# Patient Record
Sex: Male | Born: 1985 | Race: White | Hispanic: No | Marital: Single | Smoking: Current every day smoker
Health system: Southern US, Community
[De-identification: ages and names within clinical notes are randomized; demographics above are authoritative.]

## PROBLEM LIST (undated history)

## (undated) DIAGNOSIS — J45909 Unspecified asthma, uncomplicated: Secondary | ICD-10-CM

## (undated) DIAGNOSIS — K219 Gastro-esophageal reflux disease without esophagitis: Secondary | ICD-10-CM

## (undated) HISTORY — DX: Gastro-esophageal reflux disease without esophagitis: K21.9

---

## 2013-05-28 ENCOUNTER — Emergency Department: Payer: Self-pay | Admitting: Emergency Medicine

## 2013-06-04 ENCOUNTER — Emergency Department: Payer: Self-pay | Admitting: Emergency Medicine

## 2013-06-15 ENCOUNTER — Emergency Department: Payer: Self-pay | Admitting: Emergency Medicine

## 2013-12-18 ENCOUNTER — Emergency Department: Payer: Self-pay | Admitting: Emergency Medicine

## 2015-02-06 ENCOUNTER — Encounter: Payer: Self-pay | Admitting: Emergency Medicine

## 2015-02-06 ENCOUNTER — Emergency Department
Admission: EM | Admit: 2015-02-06 | Discharge: 2015-02-07 | Disposition: A | Discharge status: Home / Self Care | Payer: Self-pay | Attending: Emergency Medicine | Admitting: Emergency Medicine

## 2015-02-06 DIAGNOSIS — J4521 Mild intermittent asthma with (acute) exacerbation: Secondary | ICD-10-CM

## 2015-02-06 DIAGNOSIS — Z720 Tobacco use: Secondary | ICD-10-CM | POA: Insufficient documentation

## 2015-02-06 DIAGNOSIS — Z880 Allergy status to penicillin: Secondary | ICD-10-CM | POA: Insufficient documentation

## 2015-02-06 DIAGNOSIS — J45901 Unspecified asthma with (acute) exacerbation: Secondary | ICD-10-CM | POA: Insufficient documentation

## 2015-02-06 HISTORY — DX: Unspecified asthma, uncomplicated: J45.909

## 2015-02-06 MED ORDER — PREDNISONE 20 MG PO TABS: 60.0000 mg | ORAL_TABLET | Freq: Every day | ORAL | Status: AC

## 2015-02-06 MED ORDER — IPRATROPIUM-ALBUTEROL 0.5-2.5 (3) MG/3ML IN SOLN
3.0000 mL | Freq: Once | RESPIRATORY_TRACT | Status: AC
  Administered 2015-02-06: 3 mL via RESPIRATORY_TRACT

## 2015-02-06 MED ORDER — PREDNISONE 20 MG PO TABS
60.0000 mg | ORAL_TABLET | ORAL | Status: AC
  Administered 2015-02-06: 60 mg via ORAL

## 2015-02-06 MED ORDER — PREDNISONE 20 MG PO TABS
ORAL_TABLET | ORAL | Status: AC
  Filled 2015-02-06: qty 3

## 2015-02-06 MED ORDER — ALBUTEROL SULFATE HFA 108 (90 BASE) MCG/ACT IN AERS: 4.0000 | INHALATION_SPRAY | RESPIRATORY_TRACT | Status: DC | PRN

## 2015-02-06 MED ORDER — IPRATROPIUM-ALBUTEROL 0.5-2.5 (3) MG/3ML IN SOLN
RESPIRATORY_TRACT | Status: AC
  Filled 2015-02-06: qty 3

## 2015-02-06 NOTE — ED Provider Notes (Signed)
[facility] Emergency Department Provider Note    ____________________________________________  Time seen: 2340  I have reviewed the triage vital signs and the nursing notes.   HISTORY  Chief Complaint Asthma    HPI Alexander David is a 29 y.o. male who presents with increased work of breathing, wheezing, and mild shortness of breath starting today. He notes that he has mild, well-controlled asthma, and this feels similar to prior exacerbations. He was visiting the beach and staying in a hotel when he noticed his symptoms worsening today. He is not in any distress, but feels like he needs some medicine, and he is out of his inhaler. He has no primary care doctor. Nothing makes it better, and he notes slightly increased work of breathing with increased exertion. He denies chest pain, fever, chills, abdominal pain, nausea/vomiting.     Past Medical History  Diagnosis Date  . Asthma     There are no active problems to display for this patient.   History reviewed. No pertinent past surgical history.  Current Outpatient Rx  Name  Route  Sig  Dispense  Refill  . albuterol (PROVENTIL HFA;VENTOLIN HFA) 108 (90 BASE) MCG/ACT inhaler   Inhalation   Inhale into the lungs every 6 (six) hours as needed for wheezing or shortness of breath.           Allergies Penicillins and Sulfur  History reviewed. No pertinent family history.  Social History History  Substance Use Topics  . Smoking status: Current Every Day Smoker -- 1.00 packs/day  . Smokeless tobacco: Not on file  . Alcohol Use: No    Review of Systems  Constitutional: Negative for fever. Eyes: Negative for visual changes. ENT: Negative for sore throat. Cardiovascular: Negative for chest pain. Respiratory: Mild shortness of breath and wheezing. No accessory muscle use. Gastrointestinal: Negative for abdominal pain, vomiting and diarrhea. Genitourinary: Negative for dysuria. Musculoskeletal:  Negative for back pain. Skin: Negative for rash. Neurological: Negative for headaches, focal weakness or numbness.   10-point ROS otherwise negative.  ____________________________________________   PHYSICAL EXAM:  VITAL SIGNS: ED Triage Vitals  Enc Vitals Group     BP 02/06/15 2055 140/88 mmHg     Pulse Rate 02/06/15 2055 72     Resp --      Temp 02/06/15 2055 98.5 F (36.9 C)     Temp Source 02/06/15 2055 Oral     SpO2 02/06/15 2055 99 %     Weight 02/06/15 2055 250 lb (113.399 kg)     Height 02/06/15 2055 6' (1.829 m)     Head Cir --      Peak Flow --      Pain Score --      Pain Loc --      Pain Edu? --      Excl. in GC? --      Constitutional: Alert and oriented. Well appearing and in no distress. Some mild audible wheezes when the patient laughs. Eyes: Conjunctivae are normal. PERRL. Normal extraocular movements. ENT   Head: Normocephalic and atraumatic.   Nose: No congestion/rhinnorhea.   Mouth/Throat: Mucous membranes are moist.   Neck: No stridor. Hematological/Lymphatic/Immunilogical: No cervical lymphadenopathy. Cardiovascular: Normal rate, regular rhythm. Normal and symmetric distal pulses are present in all extremities. No murmurs, rubs, or gallops. Respiratory: Normal respiratory effort without tachypnea nor retractions. Mild to moderate wheezing throughout all lung fields. No accessory muscle usage. Good air movement in spite of wheezes. Gastrointestinal: Soft and nontender. No  distention. No abdominal bruits. There is no CVA tenderness. Moderate obesity. Genitourinary: Deferred Musculoskeletal: Nontender with normal range of motion in all extremities. No joint effusions.  No lower extremity tenderness nor edema. Neurologic:  Normal speech and language. No gross focal neurologic deficits are appreciated. Speech is normal. No gait instability. Skin:  Skin is warm, dry and intact. No rash noted. Psychiatric: Mood and affect are normal. Speech  and behavior are normal. Patient exhibits appropriate insight and judgment.  ____________________________________________   EKG  None  ____________________________________________    RADIOLOGY  Not indicated  ____________________________________________   PROCEDURES  Procedure(s) performed: None  Critical Care performed: No  ____________________________________________   INITIAL IMPRESSION / ASSESSMENT AND PLAN / ED COURSE  Pertinent labs & imaging results that were available during my care of the patient were reviewed by me and considered in my medical decision making (see chart for details).  The patient has a mild asthma exacerbation in the setting of no medications and probable allergic component. We will treat with a DuoNeb and a dose of prednisone in the emergency department and discharge him with an inhaler prescription and steroid burst prescription. [facility] will [facility]. I gave him my usual and customary return precautions, and he agrees with the plan.  ____________________________________________   FINAL CLINICAL IMPRESSION(S) / ED DIAGNOSES  Final diagnoses:  Asthma, mild intermittent, with acute exacerbation     Loleta Rose, MD 02/06/15 2359

## 2015-02-06 NOTE — Discharge Instructions (Signed)
Asthma Asthma is a condition of the lungs in which the airways tighten and narrow. Asthma can make it hard to breathe. Asthma cannot be cured, but medicine and lifestyle changes can help control it. Asthma may be started (triggered) by: 1. Animal skin flakes (dander). 2. Dust. 3. Cockroaches. 4. Pollen. 5. Mold. 6. Smoke. 7. Cleaning products. 8. Hair sprays or aerosol sprays. 9. Paint fumes or strong smells. 10. Cold air, weather changes, and winds. 11. Crying or laughing hard. 12. Stress. 13. Certain medicines or drugs. 14. Foods, such as dried fruit, potato chips, and sparkling grape juice. 15. Infections or conditions (colds, flu). 16. Exercise. 17. Certain medical conditions or diseases. 18. Exercise or tiring activities. HOME CARE   Take medicine as told by your doctor.  Use a peak flow meter as told by your doctor. A peak flow meter is a tool that measures how well the lungs are working.  Record and keep track of the peak flow meter's readings.  Understand and use the asthma action plan. An asthma action plan is a written plan for taking care of your asthma and treating your attacks.  To help prevent asthma attacks:  Do not smoke. Stay away from secondhand smoke.  Change your heating and air conditioning filter often.  Limit your use of fireplaces and wood stoves.  Get rid of pests (such as roaches and mice) and their droppings.  Throw away plants if you see mold on them.  Clean your floors. Dust regularly. Use cleaning products that do not smell.  Have someone vacuum when you are not home. Use a vacuum cleaner with a HEPA filter if possible.  Replace carpet with wood, tile, or vinyl flooring. Carpet can trap animal skin flakes and dust.  Use allergy-proof pillows, mattress covers, and box spring covers.  Wash bed sheets and blankets every week in hot water and dry them in a dryer.  Use blankets that are made of polyester or cotton.  Clean bathrooms and  kitchens with bleach. If possible, have someone repaint the walls in these rooms with mold-resistant paint. Keep out of the rooms that are being cleaned and painted.  Wash hands often. GET HELP IF:  You have make a whistling sound when breaking (wheeze), have shortness of breath, or have a cough even if taking medicine to prevent attacks.  The colored mucus you cough up (sputum) is thicker than usual.  The colored mucus you cough up changes from clear or white to yellow, green, gray, or bloody.  You have problems from the medicine you are taking such as:  A rash.  Itching.  Swelling.  Trouble breathing.  You need reliever medicines more than 2-3 times a week.  Your peak flow measurement is still at 50-79% of your personal best after following the action plan for 1 hour.  You have a fever. GET HELP RIGHT AWAY IF:   You seem to be worse and are not responding to medicine during an asthma attack.  You are short of breath even at rest.  You get short of breath when doing very little activity.  You have trouble eating, drinking, or talking.  You have chest pain.  You have a fast heartbeat.  Your lips or fingernails start to turn blue.  You are light-headed, dizzy, or faint.  Your peak flow is less than 50% of your personal best. MAKE SURE YOU:   Understand these instructions.  Will watch your condition.  Will get help right away if you  are not doing well or get worse. Document Released: 03/12/2008 Document Revised: 02/08/2014 Document Reviewed: 04/23/2013 [facility] Patient Information 2015 [place], [place]. This information is not intended to replace advice given to you by your health care provider. Make sure you discuss any questions you have with your health care provider.  Asthma Attack Prevention Although there is no way to prevent asthma from starting, you can take steps to control the disease and reduce its symptoms. Learn about your asthma and how to control  it. Take an active role to control your asthma by working with your health care provider to create and follow an asthma action plan. An asthma action plan guides you in: 46. Taking your medicines properly. 20. Avoiding things that set off your asthma or make your asthma worse (asthma triggers). 21. Tracking your level of asthma control. 22. Responding to worsening asthma. 23. Seeking emergency care when needed. To track your asthma, keep records of your symptoms, check your peak flow number using a handheld device that shows how well air moves out of your lungs (peak flow meter), and get regular asthma checkups.  WHAT ARE SOME WAYS TO PREVENT AN ASTHMA ATTACK?  Take medicines as directed by your health care provider.  Keep track of your asthma symptoms and level of control.  With your health care provider, write a detailed plan for taking medicines and managing an asthma attack. Then be sure to follow your action plan. Asthma is an ongoing condition that needs regular monitoring and treatment.  Identify and avoid asthma triggers. Many outdoor allergens and irritants (such as pollen, mold, cold air, and air pollution) can trigger asthma attacks. Find out what your asthma triggers are and take steps to avoid them.  Monitor your breathing. Learn to recognize warning signs of an attack, such as coughing, wheezing, or shortness of breath. Your lung function may decrease before you notice any signs or symptoms, so regularly measure and record your peak airflow with a home peak flow meter.  Identify and treat attacks early. If you act quickly, you are less likely to have a severe attack. You will also need less medicine to control your symptoms. When your peak flow measurements decrease and alert you to an upcoming attack, take your medicine as instructed and immediately stop any activity that may have triggered the attack. If your symptoms do not improve, get medical help.  Pay attention to increasing  quick-relief inhaler use. If you find yourself relying on your quick-relief inhaler, your asthma is not under control. See your health care provider about adjusting your treatment. WHAT CAN MAKE MY SYMPTOMS WORSE? A number of common things can set off or make your asthma symptoms worse and cause temporary increased inflammation of your airways. Keep track of your asthma symptoms for several weeks, detailing all the environmental and emotional factors that are linked with your asthma. When you have an asthma attack, go back to your asthma diary to see which factor, or combination of factors, might have contributed to it. Once you know what these factors are, you can take steps to control many of them. If you have allergies and asthma, it is important to take asthma prevention steps at home. Minimizing contact with the substance to which you are allergic will help prevent an asthma attack. Some triggers and ways to avoid these triggers are: Animal Dander:  Some people are allergic to the flakes of skin or dried saliva from animals with fur or feathers.   There is no  such thing as a hypoallergenic dog or cat breed. All dogs or cats can cause allergies, even if they don't shed.  Keep these pets out of your home.  If you are not able to keep a pet outdoors, keep the pet out of your bedroom and other sleeping areas at all times, and keep the door closed.  Remove carpets and furniture covered with cloth from your home. If that is not possible, keep the pet away from fabric-covered furniture and carpets. Dust Mites: Many people with asthma are allergic to dust mites. Dust mites are tiny bugs that are found in every home in mattresses, pillows, carpets, fabric-covered furniture, bedcovers, clothes, stuffed toys, and other fabric-covered items.   Cover your mattress in a special dust-proof cover.  Cover your pillow in a special dust-proof cover, or wash the pillow each week in hot water. Water must be hotter  than 130 F (54.4 C) to kill dust mites. Cold or warm water used with detergent and bleach can also be effective.  Wash the sheets and blankets on your bed each week in hot water.  Try not to sleep or lie on cloth-covered cushions.  Call ahead when traveling and ask for a smoke-free hotel room. Bring your own bedding and pillows in case the hotel only supplies feather pillows and down comforters, which may contain dust mites and cause asthma symptoms.  Remove carpets from your bedroom and those laid on concrete, if you can.  Keep stuffed toys out of the bed, or wash the toys weekly in hot water or cooler water with detergent and bleach. Cockroaches: Many people with asthma are allergic to the droppings and remains of cockroaches.   Keep food and garbage in closed containers. Never leave food out.  Use poison baits, traps, powders, gels, or paste (for example, boric acid).  If a spray is used to kill cockroaches, stay out of the room until the odor goes away. Indoor Mold:  Fix leaky faucets, pipes, or other sources of water that have mold around them.  Clean floors and moldy surfaces with a fungicide or diluted bleach.  Avoid using humidifiers, vaporizers, or swamp coolers. These can spread molds through the air. Pollen and Outdoor Mold:  When pollen or mold spore counts are high, try to keep your windows closed.  Stay indoors with windows closed from late morning to afternoon. Pollen and some mold spore counts are highest at that time.  Ask your health care provider whether you need to take anti-inflammatory medicine or increase your dose of the medicine before your allergy season starts. Other Irritants to Avoid:  Tobacco smoke is an irritant. If you smoke, ask your health care provider how you can quit. Ask family members to quit smoking, too. Do not allow smoking in your home or car.  If possible, do not use a wood-burning stove, kerosene heater, or fireplace. Minimize  exposure to all sources of smoke, including incense, candles, fires, and fireworks.  Try to stay away from strong odors and sprays, such as perfume, talcum powder, hair spray, and paints.  Decrease humidity in your home and use an indoor air cleaning device. Reduce indoor humidity to below 60%. Dehumidifiers or central air conditioners can do this.  Decrease house dust exposure by changing furnace and air cooler filters frequently.  Try to have someone else vacuum for you once or twice a week. Stay out of rooms while they are being vacuumed and for a short while afterward.  If you vacuum,  use a dust mask from a hardware store, a double-layered or microfilter vacuum cleaner bag, or a vacuum cleaner with a HEPA filter.  Sulfites in foods and beverages can be irritants. Do not drink beer or wine or eat dried fruit, processed potatoes, or shrimp if they cause asthma symptoms.  Cold air can trigger an asthma attack. Cover your nose and mouth with a scarf on cold or windy days.  Several health conditions can make asthma more difficult to manage, including a runny nose, sinus infections, reflux disease, psychological stress, and sleep apnea. Work with your health care provider to manage these conditions.  Avoid close contact with people who have a respiratory infection such as a cold or the flu, since your asthma symptoms may get worse if you catch the infection. Wash your hands thoroughly after touching items that may have been handled by people with a respiratory infection.  Get a flu shot every year to protect against the flu virus, which often makes asthma worse for days or weeks. Also get a pneumonia shot if you have not previously had one. Unlike the flu shot, the pneumonia shot does not need to be given yearly. Medicines:  Talk to your health care provider about whether it is safe for you to take aspirin or non-steroidal anti-inflammatory medicines (NSAIDs). In a small number of people with  asthma, aspirin and NSAIDs can cause asthma attacks. These medicines must be avoided by people who have known aspirin-sensitive asthma. It is important that people with aspirin-sensitive asthma read labels of all over-the-counter medicines used to treat pain, colds, coughs, and fever.  Beta-blockers and ACE inhibitors are other medicines you should discuss with your health care provider. HOW CAN I FIND OUT WHAT I AM ALLERGIC TO? Ask your asthma health care provider about allergy skin testing or blood testing (the RAST test) to identify the allergens to which you are sensitive. If you are found to have allergies, the most important thing to do is to try to avoid exposure to any allergens that you are sensitive to as much as possible. Other treatments for allergies, such as medicines and allergy shots (immunotherapy) are available.  CAN I EXERCISE? Follow your health care provider's advice regarding asthma treatment before exercising. It is important to maintain a regular exercise program, but vigorous exercise or exercise in cold, humid, or dry environments can cause asthma attacks, especially for those people who have exercise-induced asthma. Document Released: 09/12/2009 Document Revised: 09/29/2013 Document Reviewed: 04/01/2013 [facility] Patient Information 2015 [place], [place]. This information is not intended to replace advice given to you by your health care provider. Make sure you discuss any questions you have with your health care provider.  How to Use an Inhaler Using your inhaler correctly is very important. Good technique will make sure that the medicine reaches your lungs.  HOW TO USE AN INHALER: 24. Take the cap off the inhaler. 25. If this is the first time using your inhaler, you need to prime it. Shake the inhaler for 5 seconds. Release four puffs into the air, away from your face. Ask your doctor for help if you have questions. 26. Shake the inhaler for 5 seconds. 27. Turn the inhaler  so the bottle is above the mouthpiece. 28. Put your pointer finger on top of the bottle. Your thumb holds the bottom of the inhaler. 29. Open your mouth. 30. Either hold the inhaler away from your mouth (the width of 2 fingers) or place your lips tightly around the  mouthpiece. Ask your doctor which way to use your inhaler. 31. Breathe out as much air as possible. 32. Breathe in and push down on the bottle 1 time to release the medicine. You will feel the medicine go in your mouth and throat. 33. Continue to take a deep breath in very slowly. Try to fill your lungs. 34. After you have breathed in completely, hold your breath for 10 seconds. This will help the medicine to settle in your lungs. If you cannot hold your breath for 10 seconds, hold it for as long as you can before you breathe out. 35. Breathe out slowly, through pursed lips. Whistling is an example of pursed lips. 36. If your doctor has told you to take more than 1 puff, wait at least 15-30 seconds between puffs. This will help you get the best results from your medicine. Do not use the inhaler more than your doctor tells you to. 37. Put the cap back on the inhaler. 38. Follow the directions from your doctor or from the inhaler package about cleaning the inhaler. If you use more than one inhaler, ask your doctor which inhalers to use and what order to use them in. Ask your doctor to help you figure out when you will need to refill your inhaler.  If you use a steroid inhaler, always rinse your mouth with water after your last puff, gargle and spit out the water. Do not swallow the water. GET HELP IF:  The inhaler medicine only partially helps to stop wheezing or shortness of breath.  You are having trouble using your inhaler.  You have some increase in thick spit (phlegm). GET HELP RIGHT AWAY IF:  The inhaler medicine does not help your wheezing or shortness of breath or you have tightness in your chest.  You have dizziness,  headaches, or fast heart rate.  You have chills, fever, or night sweats.  You have a large increase of thick spit, or your thick spit is bloody. MAKE SURE YOU:   Understand these instructions.  Will watch your condition.  Will get help right away if you are not doing well or get worse. Document Released: 07/03/2008 Document Revised: 07/15/2013 Document Reviewed: 04/23/2013 [facility] Patient Information 2015 [place], [place]. This information is not intended to replace advice given to you by your health care provider. Make sure you discuss any questions you have with your health care provider.

## 2015-02-06 NOTE — ED Notes (Signed)
Pt states that he woke up this am with wheezing, states that it usually passes but today it hasn't states that he is supposed to use an inhaler but doesn't have one, denies cough or congestion. No distress noted in triage

## 2015-06-09 ENCOUNTER — Encounter: Payer: Self-pay | Admitting: Emergency Medicine

## 2015-06-09 ENCOUNTER — Emergency Department
Admission: EM | Admit: 2015-06-09 | Discharge: 2015-06-09 | Disposition: A | Discharge status: Home / Self Care | Payer: Self-pay | Attending: Emergency Medicine | Admitting: Emergency Medicine

## 2015-06-09 DIAGNOSIS — Y288XXA Contact with other sharp object, undetermined intent, initial encounter: Secondary | ICD-10-CM | POA: Insufficient documentation

## 2015-06-09 DIAGNOSIS — Z880 Allergy status to penicillin: Secondary | ICD-10-CM | POA: Insufficient documentation

## 2015-06-09 DIAGNOSIS — Y9389 Activity, other specified: Secondary | ICD-10-CM | POA: Insufficient documentation

## 2015-06-09 DIAGNOSIS — Z23 Encounter for immunization: Secondary | ICD-10-CM | POA: Insufficient documentation

## 2015-06-09 DIAGNOSIS — S91332A Puncture wound without foreign body, left foot, initial encounter: Secondary | ICD-10-CM | POA: Insufficient documentation

## 2015-06-09 DIAGNOSIS — Y998 Other external cause status: Secondary | ICD-10-CM | POA: Insufficient documentation

## 2015-06-09 DIAGNOSIS — Z720 Tobacco use: Secondary | ICD-10-CM | POA: Insufficient documentation

## 2015-06-09 DIAGNOSIS — Y9289 Other specified places as the place of occurrence of the external cause: Secondary | ICD-10-CM | POA: Insufficient documentation

## 2015-06-09 MED ORDER — AZITHROMYCIN 250 MG PO TABS: ORAL_TABLET | ORAL | Status: DC

## 2015-06-09 MED ORDER — TETANUS-DIPHTH-ACELL PERTUSSIS 5-2.5-18.5 LF-MCG/0.5 IM SUSP
0.5000 mL | Freq: Once | INTRAMUSCULAR | Status: AC
  Administered 2015-06-09: 0.5 mL via INTRAMUSCULAR
  Filled 2015-06-09: qty 0.5

## 2015-06-09 NOTE — ED Notes (Signed)
Patient states he stepped on a rusty nail on Saturday.  Patient states his tetanus shot is not up to date.  Patient is in no obvious distress at this time.

## 2015-06-09 NOTE — Discharge Instructions (Signed)
Watch for signs of infection and fever or chills Return to ER if any problems

## 2015-06-09 NOTE — ED Notes (Signed)
States stepped on a rusty nail on Saturday.  Now having stiffness to joints and not feeling well.  No redness or swelling to left foot

## 2015-06-09 NOTE — ED Provider Notes (Signed)
[facility] Emergency Department Provider Note  ____________________________________________  Time seen: Approximately 1:57 PM  I have reviewed the triage vital signs and the nursing notes.   HISTORY  Chief Complaint Puncture Wound   HPI Alexander David is a 29 y.o. male is here for a tetanus shot. He also states that he stepped on a rusty nail on Saturday which penetrated the bottom of his shoe and nicked  his great toe. He states there is been no swelling or redness. He has not seen any discharge or drainage from the area. He knows that he is "way overdue" for a Tdap.He states he has had some stiffness. He denies any fever or chills. Currently he rates his pain 0 out of 10.   Past Medical History  Diagnosis Date  . Asthma     There are no active problems to display for this patient.   No past surgical history on file.  Current Outpatient Rx  Name  Route  Sig  Dispense  Refill  . albuterol (PROVENTIL HFA;VENTOLIN HFA) 108 (90 BASE) MCG/ACT inhaler   Inhalation   Inhale into the lungs every 6 (six) hours as needed for wheezing or shortness of breath.         Marland Kitchen albuterol (PROVENTIL HFA;VENTOLIN HFA) 108 (90 BASE) MCG/ACT inhaler   Inhalation   Inhale 4 puffs into the lungs every 4 (four) hours as needed for wheezing or shortness of breath.   1 Inhaler   2   . azithromycin (ZITHROMAX Z-PAK) 250 MG tablet      Take 2 tablets (500 mg) on  Day 1,  followed by 1 tablet (250 mg) once daily on Days 2 through 5.   6 each   0     Allergies Penicillins and Sulfur  No family history on file.  Social History Social History  Substance Use Topics  . Smoking status: Current Every Day Smoker -- 1.00 packs/day  . Smokeless tobacco: None  . Alcohol Use: No    Review of Systems Constitutional: No fever/chills ENT: No sore throat. Cardiovascular: Denies chest pain. Respiratory: Denies shortness of breath. Gastrointestinal: No abdominal pain.  No  nausea, no vomiting.  Musculoskeletal: Negative for back pain. Skin: Negative for rash. Positive for puncture wound Neurological: Negative for headaches, focal weakness or numbness.  10-point ROS otherwise negative.  ____________________________________________   PHYSICAL EXAM:  VITAL SIGNS: ED Triage Vitals  Enc Vitals Group     BP 06/09/15 1331 129/74 mmHg     Pulse Rate 06/09/15 1330 70     Resp 06/09/15 1330 16     Temp 06/09/15 1330 98.1 F (36.7 C)     Temp Source 06/09/15 1330 Oral     SpO2 06/09/15 1330 97 %     Weight 06/09/15 1330 250 lb (113.399 kg)     Height 06/09/15 1330 6' (1.829 m)     Head Cir --      Peak Flow --      Pain Score 06/09/15 1331 0     Pain Loc --      Pain Edu? --      Excl. in GC? --     Constitutional: Alert and oriented. Well appearing and in no acute distress. Eyes: Conjunctivae are normal. PERRL. EOMI. Head: Atraumatic. Nose: No congestion/rhinnorhea. Neck: No stridor.   Cardiovascular: Normal rate, regular rhythm. Grossly normal heart sounds.  Good peripheral circulation. Respiratory: Normal respiratory effort.  No retractions. Lungs CTAB. Gastrointestinal: Soft and nontender.  No distention. Musculoskeletal: No lower extremity tenderness nor edema.  No joint effusions. Neurologic:  Normal speech and language. No gross focal neurologic deficits are appreciated. No gait instability. Skin:  Skin is warm, dry. There is a very superficial skin laceration to the medial  aspect base of the left great toe. There is no signs of infection. There is no erythema or tenderness. Psychiatric: Mood and affect are normal. Speech and behavior are normal.  ____________________________________________   LABS (all labs ordered are listed, but only abnormal results are displayed)  Labs Reviewed - No data to display  PROCEDURES  Procedure(s) performed: None  Critical Care performed: No  ____________________________________________   INITIAL  IMPRESSION / ASSESSMENT AND PLAN / ED COURSE  Pertinent labs & imaging results that were available during my care of the patient were reviewed by me and considered in my medical decision making (see chart for details).  Patient was given a Tdap Injection and also a prescription for Z-Pak. He is to watch the area for any signs of infection. He'll return to the emergency room if any severe worsening of his symptoms or fever or chills. ____________________________________________   FINAL CLINICAL IMPRESSION(S) / ED DIAGNOSES  Final diagnoses:  Nail wound of foot, left, initial encounter      Tommi Rumps, PA-C 06/09/15 1440  Governor Rooks, MD 06/09/15 1500

## 2015-08-29 ENCOUNTER — Emergency Department
Admission: EM | Admit: 2015-08-29 | Discharge: 2015-08-29 | Discharge status: Against Medical Advice | Payer: Self-pay | Attending: Emergency Medicine | Admitting: Emergency Medicine

## 2015-08-29 ENCOUNTER — Emergency Department: Payer: Self-pay

## 2015-08-29 DIAGNOSIS — J45909 Unspecified asthma, uncomplicated: Secondary | ICD-10-CM | POA: Insufficient documentation

## 2015-08-29 DIAGNOSIS — F17200 Nicotine dependence, unspecified, uncomplicated: Secondary | ICD-10-CM | POA: Insufficient documentation

## 2015-08-29 DIAGNOSIS — R200 Anesthesia of skin: Secondary | ICD-10-CM | POA: Insufficient documentation

## 2015-08-29 NOTE — ED Notes (Signed)
Pt reports hx of asthma and reports last pm his asthma got bad. Pt also reports he got a fishbone stuck in his throat last night as well and today has had intermittent chest pain and some numbness to his left arm. Pt reports productive cough with normal colored phlem. Denies fevers.

## 2015-08-29 NOTE — ED Notes (Signed)
Called twice and no response 

## 2015-08-30 ENCOUNTER — Telehealth: Payer: Self-pay | Admitting: Emergency Medicine

## 2015-08-30 ENCOUNTER — Emergency Department
Admission: EM | Admit: 2015-08-30 | Discharge: 2015-08-30 | Disposition: A | Discharge status: Home / Self Care | Payer: Self-pay | Attending: Emergency Medicine | Admitting: Emergency Medicine

## 2015-08-30 ENCOUNTER — Encounter: Payer: Self-pay | Admitting: Emergency Medicine

## 2015-08-30 DIAGNOSIS — Y998 Other external cause status: Secondary | ICD-10-CM | POA: Insufficient documentation

## 2015-08-30 DIAGNOSIS — X58XXXA Exposure to other specified factors, initial encounter: Secondary | ICD-10-CM | POA: Insufficient documentation

## 2015-08-30 DIAGNOSIS — T17228A Food in pharynx causing other injury, initial encounter: Secondary | ICD-10-CM | POA: Insufficient documentation

## 2015-08-30 DIAGNOSIS — J45909 Unspecified asthma, uncomplicated: Secondary | ICD-10-CM

## 2015-08-30 DIAGNOSIS — J45901 Unspecified asthma with (acute) exacerbation: Secondary | ICD-10-CM | POA: Insufficient documentation

## 2015-08-30 DIAGNOSIS — Y9389 Activity, other specified: Secondary | ICD-10-CM | POA: Insufficient documentation

## 2015-08-30 DIAGNOSIS — F17200 Nicotine dependence, unspecified, uncomplicated: Secondary | ICD-10-CM | POA: Insufficient documentation

## 2015-08-30 DIAGNOSIS — Z880 Allergy status to penicillin: Secondary | ICD-10-CM | POA: Insufficient documentation

## 2015-08-30 DIAGNOSIS — Z792 Long term (current) use of antibiotics: Secondary | ICD-10-CM | POA: Insufficient documentation

## 2015-08-30 DIAGNOSIS — Y9289 Other specified places as the place of occurrence of the external cause: Secondary | ICD-10-CM | POA: Insufficient documentation

## 2015-08-30 MED ORDER — ALBUTEROL SULFATE HFA 108 (90 BASE) MCG/ACT IN AERS: 2.0000 | INHALATION_SPRAY | Freq: Four times a day (QID) | RESPIRATORY_TRACT | Status: AC | PRN

## 2015-08-30 MED ORDER — IPRATROPIUM-ALBUTEROL 0.5-2.5 (3) MG/3ML IN SOLN
3.0000 mL | Freq: Once | RESPIRATORY_TRACT | Status: AC
  Administered 2015-08-30: 3 mL via RESPIRATORY_TRACT
  Filled 2015-08-30: qty 3

## 2015-08-30 MED ORDER — ALBUTEROL SULFATE HFA 108 (90 BASE) MCG/ACT IN AERS: 2.0000 | INHALATION_SPRAY | Freq: Once | RESPIRATORY_TRACT | Status: DC

## 2015-08-30 MED ORDER — IPRATROPIUM-ALBUTEROL 0.5-2.5 (3) MG/3ML IN SOLN
3.0000 mL | Freq: Once | RESPIRATORY_TRACT | Status: AC
  Administered 2015-08-30: 3 mL via RESPIRATORY_TRACT
  Filled 2015-08-30 (×2): qty 3

## 2015-08-30 MED ORDER — IPRATROPIUM-ALBUTEROL 0.5-2.5 (3) MG/3ML IN SOLN
RESPIRATORY_TRACT | Status: AC
  Administered 2015-08-30: 3 mL via RESPIRATORY_TRACT
  Filled 2015-08-30: qty 3

## 2015-08-30 NOTE — ED Notes (Signed)
Pt states he had sharp shooting pain from his right ear to jaw, states he ate fish on Sunday and now believes he has a fishbone stuck, states some trouble swallowing that began today, pt states he is not coughing anything up, pt speaking in full clear sentances in no acute distress

## 2015-08-30 NOTE — ED Provider Notes (Signed)
[facility] Emergency Department Provider Note    ____________________________________________  Time seen: 1905  I have reviewed the triage vital signs and the nursing notes.   HISTORY  Chief Complaint Foreign Body   History limited by: Not Limited   HPI Alexander David is a 29 y.o. male presents to the emergency department today with some concerns for possible throat injury secondary to a fishbone and difficulty breathing. He states that on Sunday he couldn't finish in her thought he might of scratched his throat. Yesterday he was having problems with his asthma and actually came to the emergency department however left before being seen by provider. He states today he was concerned because he had a sharp episode of pain on the right side of his neck. It was brief. He states it felt like a pulled muscle. He still has some soreness in that area. He denies any further sharp pains. He denies any trouble with swallowing since Sunday. He states he is able to pass food easily and without problem. In addition his breathing he thinks is likely being caused by the asthma and rather not an upper airway issue. He denies any fevers.     Past Medical History  Diagnosis Date  . Asthma     There are no active problems to display for this patient.   History reviewed. No pertinent past surgical history.  Current Outpatient Rx  Name  Route  Sig  Dispense  Refill  . albuterol (PROVENTIL HFA;VENTOLIN HFA) 108 (90 BASE) MCG/ACT inhaler   Inhalation   Inhale into the lungs every 6 (six) hours as needed for wheezing or shortness of breath.         Marland Kitchen. albuterol (PROVENTIL HFA;VENTOLIN HFA) 108 (90 BASE) MCG/ACT inhaler   Inhalation   Inhale 4 puffs into the lungs every 4 (four) hours as needed for wheezing or shortness of breath.   1 Inhaler   2   . azithromycin (ZITHROMAX Z-PAK) 250 MG tablet      Take 2 tablets (500 mg) on  Day 1,  followed by 1 tablet (250 mg) once  daily on Days 2 through 5.   6 each   0     Allergies Penicillins and Sulfur  No family history on file.  Social History Social History  Substance Use Topics  . Smoking status: Current Every Day Smoker -- 1.00 packs/day  . Smokeless tobacco: None  . Alcohol Use: No    Review of Systems  Constitutional: Negative for fever. Cardiovascular: Negative for chest pain. Respiratory: Negative for shortness of breath. Gastrointestinal: Negative for abdominal pain, vomiting and diarrhea. Neurological: Negative for headaches, focal weakness or numbness.   10-point ROS otherwise negative.  ____________________________________________   PHYSICAL EXAM:  VITAL SIGNS: ED Triage Vitals  Enc Vitals Group     BP 08/30/15 1823 132/73 mmHg     Pulse Rate 08/30/15 1823 71     Resp 08/30/15 1823 18     Temp 08/30/15 1823 98.3 F (36.8 C)     Temp Source 08/30/15 1823 Oral     SpO2 08/30/15 1823 99 %     Weight 08/30/15 1823 250 lb (113.399 kg)     Height 08/30/15 1823 6' (1.829 m)     Head Cir --      Peak Flow --      Pain Score 08/30/15 1823 1   Constitutional: Alert and oriented. Well appearing and in no distress. Eyes: Conjunctivae are normal. PERRL. Normal  extraocular movements. ENT   Head: Normocephalic and atraumatic.   Nose: No congestion/rhinnorhea.   Mouth/Throat: Mucous membranes are moist. No pharyngeal swelling, erythema or exudates.   Neck: No stridor. Non tender. No crepitus.  Hematological/Lymphatic/Immunilogical: No cervical lymphadenopathy. Cardiovascular: Normal rate, regular rhythm.  No murmurs, rubs, or gallops. Respiratory: Normal respiratory effort without tachypnea nor retractions. Mild bilateral wheezes.  Gastrointestinal: Soft and nontender. No distention.  Genitourinary: Deferred Musculoskeletal: Normal range of motion in all extremities. No joint effusions.  Neurologic:  Normal speech and language. No gross focal neurologic deficits are  appreciated.  Skin:  Skin is warm, dry and intact. No rash noted. Psychiatric: Mood and affect are normal. Speech and behavior are normal. Patient exhibits appropriate insight and judgment.  ____________________________________________    LABS (pertinent positives/negatives)  None  ____________________________________________   EKG  None  ____________________________________________    RADIOLOGY  None   ____________________________________________   PROCEDURES  Procedure(s) performed: None  Critical Care performed: No  ____________________________________________   INITIAL IMPRESSION / ASSESSMENT AND PLAN / ED COURSE  Pertinent labs & imaging results that were available during my care of the patient were reviewed by me and considered in my medical decision making (see chart for details).  Patient presented to the emergency department today because of concerns of a brief episode of neck pain as well as continued shortness of breath. At this point I think it is highly unlikely patient still has a foreign body in his throat or esophagus. Patient is able to manage his airway and secretions. He has been able to swallow. Neck is nontender.  Patient did feel better after DuoNeb treatments I think this is a very mild exacerbation of his asthma. This point given patient's good response to DuoNeb's will defer steroids at this point. Will discharge home with albuterol inhaler prescription.  ____________________________________________   FINAL CLINICAL IMPRESSION(S) / ED DIAGNOSES  Final diagnoses:  Asthma, unspecified asthma severity, uncomplicated     Phineas Semen, MD 08/30/15 2012

## 2015-08-30 NOTE — Discharge Instructions (Signed)
Please seek medical attention for any high fevers, chest pain, shortness of breath, change in behavior, persistent vomiting, bloody stool or any other new or concerning symptoms. ° ° °Asthma, Adult °Asthma is a recurring condition in which the airways tighten and narrow. Asthma can make it difficult to breathe. It can cause coughing, wheezing, and shortness of breath. Asthma episodes, also called asthma attacks, range from minor to life-threatening. Asthma cannot be cured, but medicines and lifestyle changes can help control it. °CAUSES °Asthma is believed to be caused by inherited (genetic) and environmental factors, but its exact cause is unknown. Asthma may be triggered by allergens, lung infections, or irritants in the air. Asthma triggers are different for each person. Common triggers include:  °· Animal dander. °· Dust mites. °· Cockroaches. °· Pollen from trees or grass. °· Mold. °· Smoke. °· Air pollutants such as dust, household cleaners, hair sprays, aerosol sprays, paint fumes, strong chemicals, or strong odors. °· Cold air, weather changes, and winds (which increase molds and pollens in the air). °· Strong emotional expressions such as crying or laughing hard. °· Stress. °· Certain medicines (such as aspirin) or types of drugs (such as beta-blockers). °· Sulfites in foods and drinks. Foods and drinks that may contain sulfites include dried fruit, potato chips, and sparkling grape juice. °· Infections or inflammatory conditions such as the flu, a cold, or an inflammation of the nasal membranes (rhinitis). °· Gastroesophageal reflux disease (GERD). °· Exercise or strenuous activity. °SYMPTOMS °Symptoms may occur immediately after asthma is triggered or many hours later. Symptoms include: °· Wheezing. °· Excessive nighttime or early morning coughing. °· Frequent or severe coughing with a common cold. °· Chest tightness. °· Shortness of breath. °DIAGNOSIS  °The diagnosis of asthma is made by a review of your  medical history and a physical exam. Tests may also be performed. These may include: °· Lung function studies. These tests show how much air you breathe in and out. °· Allergy tests. °· Imaging tests such as X-rays. °TREATMENT  °Asthma cannot be cured, but it can usually be controlled. Treatment involves identifying and avoiding your asthma triggers. It also involves medicines. There are 2 classes of medicine used for asthma treatment:  °· Controller medicines. These prevent asthma symptoms from occurring. They are usually taken every day. °· Reliever or rescue medicines. These quickly relieve asthma symptoms. They are used as needed and provide short-term relief. °Your health care provider will help you create an asthma action plan. An asthma action plan is a written plan for managing and treating your asthma attacks. It includes a list of your asthma triggers and how they may be avoided. It also includes information on when medicines should be taken and when their dosage should be changed. An action plan may also involve the use of a device called a peak flow meter. A peak flow meter measures how well the lungs are working. It helps you monitor your condition. °HOME CARE INSTRUCTIONS  °· Take medicines only as directed by your health care provider. Speak with your health care provider if you have questions about how or when to take the medicines. °· Use a peak flow meter as directed by your health care provider. Record and keep track of readings. °· Understand and use the action plan to help minimize or stop an asthma attack without needing to seek medical care. °· Control your home environment in the following ways to help prevent asthma attacks: °¨ Do not smoke. Avoid being exposed   to secondhand smoke. °¨ Change your heating and air conditioning filter regularly. °¨ Limit your use of fireplaces and wood stoves. °¨ Get rid of pests (such as roaches and mice) and their droppings. °¨ Throw away plants if you see  mold on them. °¨ Clean your floors and dust regularly. Use unscented cleaning products. °¨ Try to have someone else vacuum for you regularly. Stay out of rooms while they are being vacuumed and for a short while afterward. If you vacuum, use a dust mask from a hardware store, a double-layered or microfilter vacuum cleaner bag, or a vacuum cleaner with a HEPA filter. °¨ Replace carpet with wood, tile, or vinyl flooring. Carpet can trap dander and dust. °¨ Use allergy-proof pillows, mattress covers, and box spring covers. °¨ Wash bed sheets and blankets every week in hot water and dry them in a dryer. °¨ Use blankets that are made of polyester or cotton. °¨ Clean bathrooms and kitchens with bleach. If possible, have someone repaint the walls in these rooms with mold-resistant paint. Keep out of the rooms that are being cleaned and painted. °¨ Wash hands frequently. °SEEK MEDICAL CARE IF:  °· You have wheezing, shortness of breath, or a cough even if taking medicine to prevent attacks. °· The colored mucus you cough up (sputum) is thicker than usual. °· Your sputum changes from clear or white to yellow, green, gray, or bloody. °· You have any problems that may be related to the medicines you are taking (such as a rash, itching, swelling, or trouble breathing). °· You are using a reliever medicine more than 2-3 times per week. °· Your peak flow is still at 50-79% of your personal best after following your action plan for 1 hour. °· You have a fever. °SEEK IMMEDIATE MEDICAL CARE IF:  °· You seem to be getting worse and are unresponsive to treatment during an asthma attack. °· You are short of breath even at rest. °· You get short of breath when doing very little physical activity. °· You have difficulty eating, drinking, or talking due to asthma symptoms. °· You develop chest pain. °· You develop a fast heartbeat. °· You have a bluish color to your lips or fingernails. °· You are light-headed, dizzy, or faint. °· Your  peak flow is less than 50% of your personal best. °  °This information is not intended to replace advice given to you by your health care provider. Make sure you discuss any questions you have with your health care provider. °  °Document Released: 09/24/2005 Document Revised: 06/15/2015 Document Reviewed: 04/23/2013 °Elsevier Interactive Patient Education ©2016 [employer] ° °

## 2015-08-30 NOTE — ED Notes (Signed)
Patient had left message asking for results.  He left before seeing provider. i called him and he says he still has some scratchiness in throat and is askin about the fishbone on xray.  i explained that a fishbone will not necessarily show on a chest xray.  He also said he had mainly come due to chest pain that started after he was coughing to get the fishbone out.  He has no pcp.  i told [facility] about [facility] as options.   i also told him that if he thought the fishbone was still there or had concerns that he can return here for provider exam.

## 2015-08-30 NOTE — ED Notes (Signed)
Pt c/o right side throat pain, thinks he may have a fish bone

## 2016-01-12 ENCOUNTER — Emergency Department (HOSPITAL_COMMUNITY): Payer: Self-pay

## 2016-01-12 ENCOUNTER — Emergency Department (HOSPITAL_COMMUNITY)
Admission: EM | Admit: 2016-01-12 | Discharge: 2016-01-12 | Disposition: A | Discharge status: Home / Self Care | Payer: Self-pay | Attending: Emergency Medicine | Admitting: Emergency Medicine

## 2016-01-12 ENCOUNTER — Encounter (HOSPITAL_COMMUNITY): Payer: Self-pay

## 2016-01-12 DIAGNOSIS — S61201A Unspecified open wound of left index finger without damage to nail, initial encounter: Secondary | ICD-10-CM | POA: Insufficient documentation

## 2016-01-12 DIAGNOSIS — I10 Essential (primary) hypertension: Secondary | ICD-10-CM | POA: Insufficient documentation

## 2016-01-12 DIAGNOSIS — Y999 Unspecified external cause status: Secondary | ICD-10-CM | POA: Insufficient documentation

## 2016-01-12 DIAGNOSIS — Y9389 Activity, other specified: Secondary | ICD-10-CM | POA: Insufficient documentation

## 2016-01-12 DIAGNOSIS — Z87891 Personal history of nicotine dependence: Secondary | ICD-10-CM | POA: Insufficient documentation

## 2016-01-12 DIAGNOSIS — J45909 Unspecified asthma, uncomplicated: Secondary | ICD-10-CM | POA: Insufficient documentation

## 2016-01-12 DIAGNOSIS — Y929 Unspecified place or not applicable: Secondary | ICD-10-CM | POA: Insufficient documentation

## 2016-01-12 DIAGNOSIS — X58XXXA Exposure to other specified factors, initial encounter: Secondary | ICD-10-CM | POA: Insufficient documentation

## 2016-01-12 MED ORDER — DOXYCYCLINE HYCLATE 100 MG PO CAPS: 100.0000 mg | ORAL_CAPSULE | Freq: Two times a day (BID) | ORAL | Status: DC

## 2016-01-12 NOTE — Discharge Instructions (Signed)
Take the prescribed medication as directed.   Follow-up with hand specialist if continue having issues or finger does not seem to be healing. Return to the ED for new or worsening symptoms.

## 2016-01-12 NOTE — ED Provider Notes (Signed)
CSN: [id number]     Arrival date & time 01/12/16  [id number] History   First MD Initiated Contact with Patient 01/12/16 [id number]     Chief Complaint  Patient presents with  . Finger Injury     (Consider location/radiation/quality/duration/timing/severity/associated sxs/prior Treatment) The history is provided by the patient and medical records.   [age] y.o. M with hx of asthma and HTN, presenting to the ED for wound to left index finger.  Patient went fishing 2 nights ago and was cutting up some rotten fish.  He states he is not sure if he cut his hand or not.  He denies any direct trauma to finger.  States he woke up this morning and noticed some swelling and redness to left index finger.  He does have a small abrasion, states it was draining some pus this morning.  Denies fever, chills.  No hx of HIV or DM.  Tetanus is UTD.  Patient is right hand dominant.  Allergies ot penicillin and sulfa.  Past Medical History  Diagnosis Date  . Asthma   . Hypertension    History reviewed. No pertinent past surgical history. No family history on file. Social History  Substance Use Topics  . Smoking status: Current Every Day Smoker -- 1.00 packs/day  . Smokeless tobacco: None  . Alcohol Use: No    Review of Systems  Skin: Positive for wound.  All other systems reviewed and are negative.     Allergies  Penicillins and Sulfur  Home Medications   Prior to Admission medications   Medication Sig Start Date End Date Taking? Authorizing Provider  albuterol (PROVENTIL HFA;VENTOLIN HFA) 108 (90 BASE) MCG/ACT inhaler Inhale 2 puffs into the lungs every 6 (six) hours as needed for wheezing or shortness of breath. 08/30/15   Phineas SemenGraydon Goodman, MD  azithromycin (ZITHROMAX Z-PAK) 250 MG tablet Take 2 tablets (500 mg) on  Day 1,  followed by 1 tablet (250 mg) once daily on Days 2 through 5. 06/09/15   Tommi Rumpshonda L Summers, PA-C   BP 132/92 mmHg  Pulse 99  Temp(Src) 97.7 F (36.5 C) (Oral)  Resp 14  Ht 6' (1.829 m)   Wt 122.471 kg  BMI 36.61 kg/m2  SpO2 100%   Physical Exam  Constitutional: He is oriented to person, place, and time. He appears well-developed and well-nourished.  HENT:  Head: Normocephalic and atraumatic.  Mouth/Throat: Oropharynx is clear and moist.  Eyes: Conjunctivae and EOM are normal. Pupils are equal, round, and reactive to light.  Neck: Normal range of motion.  Cardiovascular: Normal rate, regular rhythm and normal heart sounds.   Pulmonary/Chest: Effort normal and breath sounds normal.  Abdominal: Soft. Bowel sounds are normal.  Musculoskeletal: Normal range of motion.       Hands: Small abrasion to dorsal aspect of middle phalanx of left index finger; surrounding swelling and erythema noted without drainage or fluctuance; finger is TTP surrounding wound; limited flexion/extension due to pain/swelling; normal cap refill; normal sensation of finger  Neurological: He is alert and oriented to person, place, and time.  Skin: Skin is warm and dry.  Psychiatric: He has a normal mood and affect.  Nursing note and vitals reviewed.   ED Course  Procedures (including critical care time) Labs Review Labs Reviewed - No data to display  Imaging Review Dg Finger Index Left  01/12/2016  CLINICAL DATA:  Index finger wound on the left after fishing injury 2 days ago. Initial encounter. EXAM: LEFT INDEX FINGER 2+V COMPARISON:  None. FINDINGS: There is no evidence of fracture or dislocation. There is no evidence of arthropathy or other focal bone abnormality. Soft tissues are unremarkable. IMPRESSION: Negative. Electronically Signed   By: Marnee Spring M.D.   On: 01/12/2016 07:09   I have personally reviewed and evaluated these images and lab results as part of my medical decision-making.   EKG Interpretation None      MDM   Final diagnoses:  Open wound of left index finger without damage to nail, initial encounter   30 y.o. M here with left index finger wound after fishing 2  days ago.  Does admit to cutting up rotten fish, unsure if he hit finger with knife.  Small wound noted to dorsal left index finger.  Surrounding erythema and swelling noted without drainage or fluctuance.  Hand remains neurovascularly intact.  X-ray negative for bony findings, no soft tissue gas noted.  Given recent handling of fish and patient's allergies (PNC and sulfur), will start on doxycycline.  Given hand surgery follow-up if not improving over the next 48 hours.  Discussed plan with patient, he/she acknowledged understanding and agreed with plan of care.  Return precautions given for new or worsening symptoms.  Garlon Hatchet, PA-C 01/12/16 0820  Samuel Jester, DO 01/16/16 [phone]

## 2016-01-12 NOTE — ED Notes (Signed)
Pt has a small wound to his left index finger that he noticed when he awoke this am.  Pt states he was fishing last night and is not sure if he caused and injury while cutting up fish.  Pt has swelling and redness to finger.

## 2016-01-21 ENCOUNTER — Emergency Department (HOSPITAL_COMMUNITY)
Admission: EM | Admit: 2016-01-21 | Discharge: 2016-01-21 | Disposition: A | Discharge status: Home / Self Care | Payer: Self-pay | Attending: Emergency Medicine | Admitting: Emergency Medicine

## 2016-01-21 ENCOUNTER — Encounter (HOSPITAL_COMMUNITY): Payer: Self-pay

## 2016-01-21 DIAGNOSIS — F17210 Nicotine dependence, cigarettes, uncomplicated: Secondary | ICD-10-CM | POA: Insufficient documentation

## 2016-01-21 DIAGNOSIS — Z79899 Other long term (current) drug therapy: Secondary | ICD-10-CM | POA: Insufficient documentation

## 2016-01-21 DIAGNOSIS — I10 Essential (primary) hypertension: Secondary | ICD-10-CM | POA: Insufficient documentation

## 2016-01-21 DIAGNOSIS — J45909 Unspecified asthma, uncomplicated: Secondary | ICD-10-CM | POA: Insufficient documentation

## 2016-01-21 DIAGNOSIS — R55 Syncope and collapse: Secondary | ICD-10-CM | POA: Insufficient documentation

## 2016-01-21 LAB — BASIC METABOLIC PANEL
Anion gap: 11 (ref 5–15)
BUN: 14 mg/dL (ref 6–20)
CO2: 24 mmol/L (ref 22–32)
CREATININE: 1.04 mg/dL (ref 0.61–1.24)
Calcium: 9.1 mg/dL (ref 8.9–10.3)
Chloride: 102 mmol/L (ref 101–111)
GFR calc Af Amer: >60 mL/min (ref >60)
GLUCOSE: 94 mg/dL (ref 65–99)
Potassium: 3.5 mmol/L (ref 3.5–5.1)
Sodium: 137 mmol/L (ref 135–145)

## 2016-01-21 LAB — CBC
HCT: 43.2 % (ref 39.0–52.0)
Hemoglobin: 14.8 g/dL (ref 13.0–17.0)
MCH: 29.8 pg (ref 26.0–34.0)
MCHC: 34.3 g/dL (ref 30.0–36.0)
MCV: 87.1 fL (ref 78.0–100.0)
PLATELETS: 242 10*3/uL (ref 150–400)
RBC: 4.96 MIL/uL (ref 4.22–5.81)
RDW: 12.5 % (ref 11.5–15.5)
WBC: 7.9 10*3/uL (ref 4.0–10.5)

## 2016-01-21 LAB — CBG MONITORING, ED: Glucose-Capillary: 77 mg/dL (ref 65–99)

## 2016-01-21 MED ORDER — SODIUM CHLORIDE 0.9 % IV BOLUS (SEPSIS)
1000.0000 mL | Freq: Once | INTRAVENOUS | Status: AC
  Administered 2016-01-21: 1000 mL via INTRAVENOUS

## 2016-01-21 NOTE — ED Provider Notes (Signed)
CSN: [id number]     Arrival date & time 01/21/16  1730 History   First MD Initiated Contact with Patient 01/21/16 1735     Chief Complaint  Patient presents with  . Near Syncope     (Consider location/radiation/quality/duration/timing/severity/associated sxs/prior Treatment) HPI 30 year old male with history of hypertension and asthma who presents with near syncope. 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 Associated with palpitations. No shortness of breath, leg swelling or leg pain. States that he sat down, and the feeling resolved. States that he has been on his feet for quite some time earlier today and that in the environment that he works is very warm. States that he has been trying to keep up with his hydration. No recent illnesses. Denies nausea or vomiting, diarrhea, cough or upper respiratory infection recently. No recent travel, recent immobilization, personal or family history of PE/DVT.   Past Medical History  Diagnosis Date  . Asthma   . Hypertension    History reviewed. No pertinent past surgical history. No family history on file. Social History  Substance Use Topics  . Smoking status: Current Every Day Smoker -- 1.00 packs/day    Types: Cigarettes  . Smokeless tobacco: None  . Alcohol Use: No    Review of Systems 10/14 systems reviewed and are negative other than those stated in the HPI    Allergies  Penicillins and Sulfur  Home Medications   Prior to Admission medications   Medication Sig Start Date End Date Taking? Authorizing Provider  albuterol (PROVENTIL HFA;VENTOLIN HFA) 108 (90 BASE) MCG/ACT inhaler Inhale 2 puffs into the lungs every 6 (six) hours as needed for wheezing or shortness of breath. 08/30/15  Yes Phineas Semen, MD   BP 117/85 mmHg  Pulse 89  Temp(Src) 98.6 F (37 C) (Oral)  Resp 16  Ht 6' (1.829 m)  Wt 270 lb (122.471 kg)  BMI 36.61 kg/m2  SpO2 100% Physical Exam Physical Exam  Nursing  note and vitals reviewed. Constitutional: Well developed, well nourished, non-toxic, and in no acute distress Head: Normocephalic and atraumatic.  Mouth/Throat: Oropharynx is clear and moist.  Neck: Normal range of motion. Neck supple.  Cardiovascular: Tachycardic rate and regular rhythm.  No edema. Pulmonary/Chest: Effort normal and breath sounds normal.  Abdominal: Soft. There is no tenderness. There is no rebound and no guarding.  Musculoskeletal: Normal range of motion.  Neurological: Alert, no facial droop, fluent speech, moves all extremities symmetrically Skin: Skin is warm and dry.  Psychiatric: Cooperative   ED Course  Procedures (including critical care time) Labs Review Labs Reviewed  BASIC METABOLIC PANEL  CBC  CBG MONITORING, ED    Imaging Review No results found. I have personally reviewed and evaluated these images and lab results as part of my medical decision-making.   EKG Interpretation   Date/Time:  Saturday January 21 2016 17:38:22 EDT Ventricular Rate:  110 PR Interval:  166 QRS Duration: 103 QT Interval:  339 QTC Calculation: 459 R Axis:   -21 Text Interpretation:  Sinus tachycardia Borderline left axis deviation  Aside from tachycardia no significant changes from prior  Confirmed by Bettyjo Lundblad  MD, Diann Bangerter [phone]) on 01/21/2016 5:46:59 PM      MDM   Final diagnoses:  Near syncope    30 year old male who presents with near syncope. He is nontoxic in no acute distress. With some tachycardia on arrival, and clinically appears dry and anxious on exam. EKG without stigmata of arrhythmia  or acute ischemic changes or evidence of heart strain. Basic blood work unremarkable. History suggestive of likely orthostatic hypotension. He has no risk factors arrhythmia, structural heart problems, PE, or other life-threatening causes. Given IV fluids with resolution of his tachycardia. Has been able to ambulate here in the emergency department and states that he feels  significantly improved. Strict return and follow-up instructions reviewed. She expressed understanding of all discharge instructions and felt comfortable with the plan of care.     Lavera Guiseana Duo Alfredia Desanctis, MD 01/21/16 [phone]

## 2016-01-21 NOTE — ED Notes (Signed)
MD at bedside. 

## 2016-01-21 NOTE — ED Notes (Signed)
Patient states he was at work standing and felt like he was going to pass out and had palpitations. Denies pain. Patient alert and oriented x4.

## 2016-01-21 NOTE — Discharge Instructions (Signed)
Please return without fail for worsening symptoms, including difficulty breathing, passing out recurrently, or any other symptoms concerning to you.   Near-Syncope Near-syncope (commonly known as near fainting) is sudden weakness, dizziness, or feeling like you might pass out. During an episode of near-syncope, you may also develop pale skin, have tunnel vision, or feel sick to your stomach (nauseous). Near-syncope may occur when getting up after sitting or while standing for a long time. It is caused by a sudden decrease in blood flow to the brain. This decrease can result from various causes or triggers, most of which are not serious. However, because near-syncope can sometimes be a sign of something serious, a medical evaluation is required. The specific cause is often not determined. HOME CARE INSTRUCTIONS  Monitor your condition for any changes. The following actions may help to alleviate any discomfort you are experiencing:  Have someone stay with you until you feel stable.  Lie down right away and prop your feet up if you start feeling like you might faint. Breathe deeply and steadily. Wait until all the symptoms have passed. Most of these episodes last only a few minutes. You may feel tired for several hours.   Drink enough fluids to keep your urine clear or pale yellow.   If you are taking blood pressure or heart medicine, get up slowly when seated or lying down. Take several minutes to sit and then stand. This can reduce dizziness.  Follow up with your health care provider as directed. SEEK IMMEDIATE MEDICAL CARE IF:   You have a severe headache.   You have unusual pain in the chest, abdomen, or back.   You are bleeding from the mouth or rectum, or you have black or tarry stool.   You have an irregular or very fast heartbeat.   You have repeated fainting or have seizure-like jerking during an episode.   You faint when sitting or lying down.   You have confusion.    You have difficulty walking.   You have severe weakness.   You have vision problems.  MAKE SURE YOU:   Understand these instructions.  Will watch your condition.  Will get help right away if you are not doing well or get worse.   This information is not intended to replace advice given to you by your health care provider. Make sure you discuss any questions you have with your health care provider.   Document Released: 09/24/2005 Document Revised: 09/29/2013 Document Reviewed: 02/27/2013 Elsevier Interactive Patient Education [employer]2016 Elsevier Inc.

## 2016-01-27 ENCOUNTER — Emergency Department (HOSPITAL_COMMUNITY)
Admission: EM | Admit: 2016-01-27 | Discharge: 2016-01-27 | Disposition: A | Discharge status: Home / Self Care | Payer: Self-pay | Attending: Emergency Medicine | Admitting: Emergency Medicine

## 2016-01-27 ENCOUNTER — Encounter (HOSPITAL_COMMUNITY): Payer: Self-pay

## 2016-01-27 DIAGNOSIS — F17210 Nicotine dependence, cigarettes, uncomplicated: Secondary | ICD-10-CM | POA: Insufficient documentation

## 2016-01-27 DIAGNOSIS — J45909 Unspecified asthma, uncomplicated: Secondary | ICD-10-CM | POA: Insufficient documentation

## 2016-01-27 DIAGNOSIS — L03012 Cellulitis of left finger: Secondary | ICD-10-CM | POA: Insufficient documentation

## 2016-01-27 DIAGNOSIS — Z79899 Other long term (current) drug therapy: Secondary | ICD-10-CM | POA: Insufficient documentation

## 2016-01-27 DIAGNOSIS — IMO0001 Reserved for inherently not codable concepts without codable children: Secondary | ICD-10-CM

## 2016-01-27 MED ORDER — IBUPROFEN 800 MG PO TABS
800.0000 mg | ORAL_TABLET | Freq: Once | ORAL | Status: DC
  Filled 2016-01-27: qty 1

## 2016-01-27 MED ORDER — DOXYCYCLINE HYCLATE 100 MG PO CAPS: 100.0000 mg | ORAL_CAPSULE | Freq: Two times a day (BID) | ORAL | Status: DC

## 2016-01-27 MED ORDER — IBUPROFEN 800 MG PO TABS: 800.0000 mg | ORAL_TABLET | Freq: Three times a day (TID) | ORAL | Status: AC | PRN

## 2016-01-27 MED ORDER — DOXYCYCLINE HYCLATE 100 MG PO TABS
100.0000 mg | ORAL_TABLET | Freq: Once | ORAL | Status: AC
  Administered 2016-01-27: 100 mg via ORAL
  Filled 2016-01-27: qty 1

## 2016-01-27 MED ORDER — HYDROCODONE-ACETAMINOPHEN 5-325 MG PO TABS: 1.0000 | ORAL_TABLET | Freq: Four times a day (QID) | ORAL | Status: DC | PRN

## 2016-01-27 NOTE — ED Provider Notes (Signed)
TIME SEEN: 3:30 AM  CHIEF COMPLAINT: left second digit pain and swelling  HPI: Pt is a 30 y.o. right-hand-dominant male with history of hypertension, asthma who presents to the emergency department with complaints of left second finger redness, swelling and pain. Was seen in the emergency department on April 6 after he actually cut himself in the left second digit while cutting fish. At that time he had a cellulitis and was placed on doxycycline. Had an x-ray which showed no gas, foreign body, fracture. Reports after several days of doxycycline his swelling, pain and redness completely resolved. States that the last time he had any pain or swelling in his hand before today was 10 days ago. Finished his antibiotics one week ago. Denies any new injury. He is not a diabetic or immunocompromised. Denies any fever.  ROS: See HPI Constitutional: no fever  Eyes: no drainage  ENT: no runny nose   Cardiovascular:  no chest pain  Resp: no SOB  GI: no vomiting GU: no dysuria Integumentary: no rash  Allergy: no hives  Musculoskeletal: no leg swelling  Neurological: no slurred speech ROS otherwise negative  PAST MEDICAL HISTORY/PAST SURGICAL HISTORY:  Past Medical History  Diagnosis Date  . Asthma     MEDICATIONS:  Prior to Admission medications   Medication Sig Start Date End Date Taking? Authorizing Provider  albuterol (PROVENTIL HFA;VENTOLIN HFA) 108 (90 BASE) MCG/ACT inhaler Inhale 2 puffs into the lungs every 6 (six) hours as needed for wheezing or shortness of breath. 08/30/15  Yes Phineas Semen, MD    ALLERGIES:  Allergies  Allergen Reactions  . Penicillins Hives    Has patient had a PCN reaction causing immediate rash, facial/tongue/throat swelling, SOB or lightheadedness with hypotension: Yes Has patient had a PCN reaction causing severe rash involving mucus membranes or skin necrosis: No Has patient had a PCN reaction that required hospitalization No Has patient had a PCN  reaction occurring within the last 10 years: No If all of the above answers are "NO", then may proceed with Cephalosporin use.   Lavera Guise Hives    SOCIAL HISTORY:  Social History  Substance Use Topics  . Smoking status: Current Every Day Smoker -- 1.00 packs/day    Types: Cigarettes  . Smokeless tobacco: Not on file  . Alcohol Use: No    FAMILY HISTORY: No family history on file.  EXAM: BP 129/75 mmHg  Pulse 84  Temp(Src) 97.8 F (36.6 C) (Oral)  Resp 16  Ht 6' (1.829 m)  Wt 270 lb (122.471 kg)  BMI 36.61 kg/m2  SpO2 100% CONSTITUTIONAL: Alert and oriented and responds appropriately to questions. Well-appearing; well-nourished HEAD: Normocephalic EYES: Conjunctivae clear, PERRL ENT: normal nose; no rhinorrhea; moist mucous membranes NECK: Supple, no meningismus, no LAD  CARD: RRR; S1 and S2 appreciated; no murmurs, no clicks, no rubs, no gallops RESP: Normal chest excursion without splinting or tachypnea; breath sounds clear and equal bilaterally; no wheezes, no rhonchi, no rales, no hypoxia or respiratory distress, speaking full sentences ABD/GI: Normal bowel sounds; non-distended; soft, non-tender, no rebound, no guarding, no peritoneal signs BACK:  The back appears normal and is non-tender to palpation, there is no CVA tenderness EXT: Patient has a 2 cm x 0.5 cm area of redness, swelling and tenderness between the PIP and DIP of the radial aspect of the second left digit. There is no fluctuance or induration. No tenderness over the flexor tendon of this finger. Does have decreased range of motion secondary to swelling  and pain but is able to flex the finger partially and fully extend it. His normal capillary refill in this finger. Nothing to suggest a paronychia, herpetic whitlow or felon on exam. No subcutaneous emphysema on exam. 2+ radial pulses bilaterally.  Otherwise Normal ROM in all joints; otherwise externally is are non-tender to palpation; no edema; normal capillary  refill; no cyanosis, no calf tenderness or swelling    SKIN: Normal color for age and race; warm; no rash NEURO: Moves all extremities equally, sensation to light touch intact diffusely, cranial nerves II through XII intact PSYCH: The patient's mood and manner are appropriate. Grooming and personal hygiene are appropriate.  MEDICAL DECISION MAKING: Patient here a small area of cellulitis over the extensor aspect of the left second digit. Previously had an infection to this finger which resolved completely 10 days ago. Symptoms started back within the past 24 hours. He denies that he has had any new injury. No sign of abscess on exam. No sign of flexor tenosynovitis, felon, herpetic whitlow, paronychia. Given he improved significantly with doxycycline, will send him home with another course of doxycycline for 10 days. Will also discharge with ibuprofen and Vicodin. I do not feel this time he has any emergent reason to see a hand surgeon discussed with him if symptoms worsen, he should either see a hand surgeon or follow-up in the emergency department. He does have a PCP for follow-up as well.  He is comfortable with this plan.    At this time, I do not feel there is any life-threatening condition present. I have reviewed and discussed all results (EKG, imaging, lab, urine as appropriate), exam findings with patient. I have reviewed nursing notes and appropriate previous records.  I feel the patient is safe to be discharged home without further emergent workup. Discussed usual and customary return precautions. Patient and family (if present) verbalize understanding and are comfortable with this plan.  Patient will follow-up with their primary care provider. If they do not have a primary care provider, information for follow-up has been provided to them. All questions have been answered.      Layla MawKristen N Ward, DO 01/27/16 0400

## 2016-01-27 NOTE — ED Notes (Signed)
Pt just finished a course of doxy for left hand infection, states yesterday he noticed some redness and increasing pain to the index finger of the same hand.

## 2016-01-27 NOTE — ED Notes (Signed)
Patient given discharge instruction, verbalized understand. Pre pack pain meds given. Patient ambulatory out of the department.

## 2016-01-27 NOTE — Discharge Instructions (Signed)

## 2016-02-01 MED FILL — Hydrocodone-Acetaminophen Tab 5-325 MG: ORAL | Qty: 6 | Status: AC

## 2017-03-26 ENCOUNTER — Emergency Department (HOSPITAL_COMMUNITY): Payer: Self-pay

## 2017-03-26 ENCOUNTER — Encounter (HOSPITAL_COMMUNITY): Payer: Self-pay | Admitting: Emergency Medicine

## 2017-03-26 ENCOUNTER — Emergency Department (HOSPITAL_COMMUNITY)
Admission: EM | Admit: 2017-03-26 | Discharge: 2017-03-26 | Disposition: A | Discharge status: Home / Self Care | Payer: Self-pay | Attending: Emergency Medicine | Admitting: Emergency Medicine

## 2017-03-26 DIAGNOSIS — M25061 Hemarthrosis, right knee: Secondary | ICD-10-CM | POA: Insufficient documentation

## 2017-03-26 DIAGNOSIS — Y929 Unspecified place or not applicable: Secondary | ICD-10-CM | POA: Insufficient documentation

## 2017-03-26 DIAGNOSIS — S83206A Unspecified tear of unspecified meniscus, current injury, right knee, initial encounter: Secondary | ICD-10-CM | POA: Insufficient documentation

## 2017-03-26 DIAGNOSIS — M25569 Pain in unspecified knee: Secondary | ICD-10-CM

## 2017-03-26 DIAGNOSIS — M25561 Pain in right knee: Secondary | ICD-10-CM

## 2017-03-26 DIAGNOSIS — Y939 Activity, unspecified: Secondary | ICD-10-CM | POA: Insufficient documentation

## 2017-03-26 DIAGNOSIS — F17210 Nicotine dependence, cigarettes, uncomplicated: Secondary | ICD-10-CM | POA: Insufficient documentation

## 2017-03-26 DIAGNOSIS — J45909 Unspecified asthma, uncomplicated: Secondary | ICD-10-CM | POA: Insufficient documentation

## 2017-03-26 DIAGNOSIS — M2500 Hemarthrosis, unspecified joint: Secondary | ICD-10-CM

## 2017-03-26 DIAGNOSIS — W19XXXA Unspecified fall, initial encounter: Secondary | ICD-10-CM | POA: Insufficient documentation

## 2017-03-26 DIAGNOSIS — Y999 Unspecified external cause status: Secondary | ICD-10-CM | POA: Insufficient documentation

## 2017-03-26 DIAGNOSIS — S838X1A Sprain of other specified parts of right knee, initial encounter: Secondary | ICD-10-CM

## 2017-03-26 MED ORDER — OXYCODONE-ACETAMINOPHEN 5-325 MG PO TABS
1.0000 | ORAL_TABLET | Freq: Once | ORAL | Status: AC
Start: 2017-03-26 — End: 2017-03-26
  Administered 2017-03-26: 1 via ORAL
  Filled 2017-03-26: qty 1

## 2017-03-26 MED ORDER — OXYCODONE-ACETAMINOPHEN 5-325 MG PO TABS: 1.0000 | ORAL_TABLET | ORAL | 0 refills | Status: DC | PRN

## 2017-03-26 NOTE — ED Notes (Signed)
Pt states understanding of care given and follow up instructions.  Pt a/o ambulated from ED on crutches with SO

## 2017-03-26 NOTE — ED Triage Notes (Addendum)
Pt reports slipped while walking yesterday and reports "right knee became dislocated." pt reports "put knee back into place." pt reports continued pain with ambulation and weight bearing. Pt reports took vicodin PTA with minimal relief. No obvious deformity noted.

## 2017-03-26 NOTE — ED Notes (Signed)
Pt taken to xray at this time.

## 2017-03-26 NOTE — ED Provider Notes (Signed)
[facility]-EMERGENCY DEPT Provider Note   CSN: [id number] Arrival date & time: 03/26/17  1449     History   Chief Complaint Chief Complaint  Patient presents with  . Knee Pain    HPI Alexander David is a 31 y.o. male.  Patellar dislocation yesterday. Reduced it but continued pain and swelling to right knee today.     Knee Pain   This is a new problem. The current episode started yesterday. The problem occurs constantly. The problem has not changed since onset.The pain is present in the right knee. The quality of the pain is described as aching and sharp. The pain is moderate. Associated symptoms include stiffness. Pertinent negatives include no numbness. He has tried nothing for the symptoms.    Past Medical History:  Diagnosis Date  . Asthma     There are no active problems to display for this patient.   History reviewed. No pertinent surgical history.     Home Medications    Prior to Admission medications   Medication Sig Start Date End Date Taking? Authorizing Provider  albuterol (PROVENTIL HFA;VENTOLIN HFA) 108 (90 BASE) MCG/ACT inhaler Inhale 2 puffs into the lungs every 6 (six) hours as needed for wheezing or shortness of breath. 08/30/15   Phineas SemenGoodman, Graydon, MD  doxycycline (VIBRAMYCIN) 100 MG capsule Take 1 capsule (100 mg total) by mouth 2 (two) times daily. 01/27/16   Ward, Layla MawKristen N, DO  HYDROcodone-acetaminophen (NORCO/VICODIN) 5-325 MG tablet Take 1-2 tablets by mouth every 6 (six) hours as needed. 01/27/16   Ward, Layla MawKristen N, DO  HYDROcodone-acetaminophen (NORCO/VICODIN) 5-325 MG tablet Take 1-2 tablets by mouth every 6 (six) hours as needed. 01/27/16   Ward, Layla MawKristen N, DO  ibuprofen (ADVIL,MOTRIN) 800 MG tablet Take 1 tablet (800 mg total) by mouth every 8 (eight) hours as needed for mild pain. 01/27/16   Ward, Layla MawKristen N, DO  oxyCODONE-acetaminophen (PERCOCET) 5-325 MG tablet Take 1-2 tablets by mouth every 4 (four) hours as needed for severe pain. 03/26/17   Bobbijo Holst,  Barbara CowerJason, MD    Family History History reviewed. No pertinent family history.  Social History Social History  Substance Use Topics  . Smoking status: Current Every Day Smoker    Packs/day: 1.00    Types: Cigarettes  . Smokeless tobacco: Never Used  . Alcohol use No     Allergies   Penicillins and Sulfur   Review of Systems Review of Systems  Musculoskeletal: Positive for stiffness.  Neurological: Negative for numbness.  All other systems reviewed and are negative.    Physical Exam Updated Vital Signs BP (!) 133/93 (BP Location: Right Arm)   Pulse 74   Temp 98.4 F (36.9 C) (Oral)   Resp 16   Ht 6' (1.829 m)   Wt (!) 140.6 kg (310 lb)   SpO2 99%   BMI 42.04 kg/m   Physical Exam  Constitutional: He is oriented to person, place, and time. He appears well-developed and well-nourished.  HENT:  Head: Normocephalic and atraumatic.  Eyes: Conjunctivae and EOM are normal.  Neck: Normal range of motion.  Cardiovascular: Normal rate.  Exam reveals no friction rub.   No murmur heard. Pulmonary/Chest: Effort normal. No respiratory distress.  Abdominal: Soft. He exhibits no distension.  Genitourinary: Rectal exam shows guaiac negative stool.  Musculoskeletal: Normal range of motion. He exhibits no edema or deformity.  Neurological: He is alert and oriented to person, place, and time. No cranial nerve deficit. Coordination normal.  Skin: Skin is warm and dry.  Nursing note and vitals reviewed.    ED Treatments / Results  Labs (all labs ordered are listed, but only abnormal results are displayed) Labs Reviewed - No data to display  EKG  EKG Interpretation None       Radiology Dg Knee Complete 4 Views Right  Result Date: 03/26/2017 CLINICAL DATA:  Injury. EXAM: RIGHT KNEE - COMPLETE 4+ VIEW COMPARISON:  No recent prior . FINDINGS: Prominent knee joint effusion. Tiny osteochondral fracture fragment along the medial tibial plateau cannot be excluded. Small  fracture stripped from the posterior inferior aspect of the patella cannot be excluded . MRI may prove useful for further evaluation. IMPRESSION: 1. Prominent knee joint effusion. 2. Tiny osteochondral fracture fragment along the medial tibial plateau cannot be completely excluded . Small fracture chip along the posterior aspect of the inferior portion of patella cannot be excluded . MRI may prove useful further evaluate. Electronically Signed   By: Maisie Fus  Register   On: 03/26/2017 15:38    Procedures Procedures (including critical care time)  Medications Ordered in ED Medications  oxyCODONE-acetaminophen (PERCOCET/ROXICET) 5-325 MG per tablet 1 tablet (1 tablet Oral Given 03/26/17 1722)     Initial Impression / Assessment and Plan / ED Course  I have reviewed the triage vital signs and the nursing notes.  Pertinent labs & imaging results that were available during my care of the patient were reviewed by me and considered in my medical decision making (see chart for details).     31 yo M with likely ligamentous injury. MRI done for ortho follow up. Crutches/knee immobilizer provided. Pain meds provided.   Final Clinical Impressions(s) / ED Diagnoses   Final diagnoses:  Knee pain  Acute pain of right knee  Hemarthrosis  Injury of meniscus of right knee, initial encounter    New Prescriptions New Prescriptions   OXYCODONE-ACETAMINOPHEN (PERCOCET) 5-325 MG TABLET    Take 1-2 tablets by mouth every 4 (four) hours as needed for severe pain.     Marily Memos, MD 03/26/17 2037

## 2017-04-02 ENCOUNTER — Encounter: Payer: Self-pay | Admitting: Orthopaedic Surgery

## 2017-04-02 ENCOUNTER — Ambulatory Visit (INDEPENDENT_AMBULATORY_CARE_PROVIDER_SITE_OTHER): Payer: Self-pay | Admitting: Orthopaedic Surgery

## 2017-04-02 VITALS — BP 139/92 | HR 102 | Temp 97.7°F | Ht 72.0 in | Wt 311.0 lb

## 2017-04-02 DIAGNOSIS — S83004A Unspecified dislocation of right patella, initial encounter: Secondary | ICD-10-CM

## 2017-04-02 MED ORDER — HYDROCODONE-ACETAMINOPHEN 7.5-325 MG PO TABS: ORAL_TABLET | ORAL | 0 refills | Status: DC

## 2017-04-02 NOTE — Progress Notes (Signed)
Subjective:    Patient ID: Alexander David, male    DOB: 1986/07/28, 31 y.o.   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  He had pain in the knee and felt his knee cap go laterally.  He was able to push it back but continued to have pain.  He went to the ER the next day.  MRI and x-rays were done showing: IMPRESSION: 1. Findings concerning for transient lateral patellar dislocation with mild osseous contusions of the medial patella and periphery of the anterolateral femoral condyles. Lateral patellar tilting. TT -TG distance 2 cm. Partial tear of the patellar insertion of the MPFL and medial patellar retinaculum. 2. Cartilage abnormalities of the medial femorotibial compartment and patellofemoral compartment as described above. 3. Mild tendinosis of the proximal patellar tendon.  I have explained the above to him.  He has been using a knee immobilizer and crutches.  I have given Rx for knee sleeve hinged patella tracking.  He will need to use this for about two to three more weeks.  I have told him about possible recurrence.   Review of Systems  HENT: Negative for congestion.   Cardiovascular: Negative for chest pain and leg swelling.  Endocrine: Negative for cold intolerance.  Musculoskeletal: Positive for arthralgias, gait problem and joint swelling.  Allergic/Immunologic: Negative for environmental allergies.   Past Medical History:  Diagnosis Date  . Asthma   . GERD (gastroesophageal reflux disease)     History reviewed. No pertinent surgical history.  Current Outpatient Prescriptions on File Prior to Visit  Medication Sig Dispense Refill  . albuterol (PROVENTIL HFA;VENTOLIN HFA) 108 (90 BASE) MCG/ACT inhaler Inhale 2 puffs into the lungs every 6 (six) hours as needed for wheezing or shortness of breath. 1 Inhaler 2  . doxycycline (VIBRAMYCIN) 100 MG capsule Take 1 capsule (100 mg total) by mouth 2 (two) times daily.  20 capsule 0  . ibuprofen (ADVIL,MOTRIN) 800 MG tablet Take 1 tablet (800 mg total) by mouth every 8 (eight) hours as needed for mild pain. 30 tablet 0   No current facility-administered medications on file prior to visit.     Social History   Social History  . Marital status: Single    Spouse name: N/A  . Number of children: N/A  . Years of education: N/A   Occupational History  . Not on file.   Social History Main Topics  . Smoking status: Current Every Day Smoker    Packs/day: 1.00    Types: Cigarettes  . Smokeless tobacco: Never Used  . Alcohol use No  . Drug use: No  . Sexual activity: Yes   Other Topics Concern  . Not on file   Social History Narrative  . No narrative on file    Family History  Problem Relation Age of Onset  . Mental illness Mother     BP (!) 139/92   Pulse (!) 102   Temp 97.7 F (36.5 C)   Ht 6' (1.829 m)   Wt (!) 311 lb (141.1 kg)   BMI 42.18 kg/m      Objective:   Physical Exam  Constitutional: He is oriented to person, place, and time. He appears well-developed and well-nourished.  HENT:  Head: Normocephalic and atraumatic.  Eyes: Conjunctivae and EOM are normal. Pupils are equal, round, and reactive to light.  Neck: Normal range of motion. Neck supple.  Cardiovascular: Normal rate, regular rhythm and intact  distal pulses.   Pulmonary/Chest: Effort normal.  Abdominal: Soft.  Musculoskeletal: He exhibits tenderness (Pain right knee with effusion 1+.  Medial pain around patella.  Patella located.  ROM 0 to 100 with pain.  NV intact.  Left knee normal.  ).  Neurological: He is alert and oriented to person, place, and time. He has normal reflexes. He displays normal reflexes. No cranial nerve deficit. He exhibits normal muscle tone. Coordination normal.  Skin: Skin is warm and dry.  Psychiatric: He has a normal mood and affect. His behavior is normal. Judgment and thought content normal.          Assessment & Plan:    Encounter Diagnosis  Name Primary?  . Dislocation of right patella, initial encounter Yes   Continue knee immobilizer.  Return in two weeks.  Call if any problem.  Precautions discussed.   I have reviewed the [place]  Controlled Substance Reporting System web site prior to prescribing narcotic medicine for this patient.  Electronically Signed Darreld McleanWayne Yenni Carra, MD 6/26/20189:06 AM

## 2017-04-02 NOTE — Patient Instructions (Signed)
Steps to Quit Smoking Smoking tobacco can be bad for your health. It can also affect almost every organ in your body. Smoking puts you and people around you at risk for many serious Alexander David-lasting (chronic) diseases. Quitting smoking is hard, but it is one of the best things that you can do for your health. It is never too late to quit. What are the benefits of quitting smoking? When you quit smoking, you lower your risk for getting serious diseases and conditions. They can include:  Lung cancer or lung disease.  Heart disease.  Stroke.  Heart attack.  Not being able to have children (infertility).  Weak bones (osteoporosis) and broken bones (fractures).  If you have coughing, wheezing, and shortness of breath, those symptoms may get better when you quit. You may also get sick less often. If you are pregnant, quitting smoking can help to lower your chances of having a baby of low birth weight. What can I do to help me quit smoking? Talk with your doctor about what can help you quit smoking. Some things you can do (strategies) include:  Quitting smoking totally, instead of slowly cutting back how much you smoke over a period of time.  Going to in-person counseling. You are more likely to quit if you go to many counseling sessions.  Using resources and support systems, such as: ? Online chats with a counselor. ? Phone quitlines. ? Printed self-help materials. ? Support groups or group counseling. ? Text messaging programs. ? Mobile phone apps or applications.  Taking medicines. Some of these medicines may have nicotine in them. If you are pregnant or breastfeeding, do not take any medicines to quit smoking unless your doctor says it is okay. Talk with your doctor about counseling or other things that can help you.  Talk with your doctor about using more than one strategy at the same time, such as taking medicines while you are also going to in-person counseling. This can help make  quitting easier. What things can I do to make it easier to quit? Quitting smoking might feel very hard at first, but there is a lot that you can do to make it easier. Take these steps:  Talk to your family and friends. Ask them to support and encourage you.  Call phone quitlines, reach out to support groups, or work with a counselor.  Ask people who smoke to not smoke around you.  Avoid places that make you want (trigger) to smoke, such as: ? Bars. ? Parties. ? Smoke-break areas at work.  Spend time with people who do not smoke.  Lower the stress in your life. Stress can make you want to smoke. Try these things to help your stress: ? Getting regular exercise. ? Deep-breathing exercises. ? Yoga. ? Meditating. ? Doing a body scan. To do this, close your eyes, focus on one area of your body at a time from head to toe, and notice which parts of your body are tense. Try to relax the muscles in those areas.  Download or buy apps on your mobile phone or tablet that can help you stick to your quit plan. There are many free apps, such as QuitGuide from the CDC (Centers for Disease Control and Prevention). You can find more support from smokefree.gov and other websites.  This information is not intended to replace advice given to you by your health care provider. Make sure you discuss any questions you have with your health care provider. Document Released: 07/21/2009 Document   Revised: 05/22/2016 Document Reviewed: 02/08/2015 Elsevier Interactive Patient Education  2018 [employer]  

## 2017-04-16 ENCOUNTER — Ambulatory Visit: Payer: Self-pay | Admitting: Orthopaedic Surgery

## 2018-12-22 IMAGING — MR MR KNEE*R* W/O CM
4 of 6 series · 12 of 40 positions shown · non-contrast
Comparison: None.

CLINICAL DATA: Status post fall.  Twisting injury of the leg.

EXAM:
MRI OF THE RIGHT KNEE WITHOUT CONTRAST
TECHNIQUE: Multiplanar, multisequence MR imaging of the knee was performed. No
intravenous contrast was administered.

[Series 4: pdfs axial · axial · 3.0mm · 0.25mm/px · z∈[-34,+45]mm · 3 of 32 slices shown]
[im 5/32]
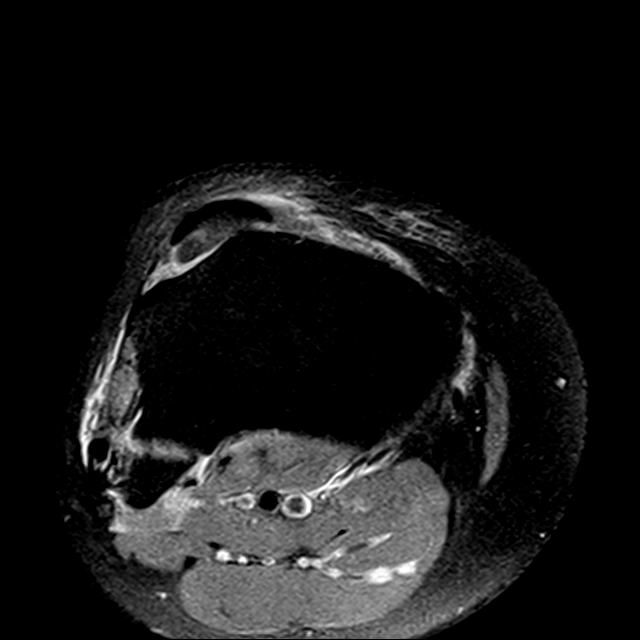
[im 18/32]
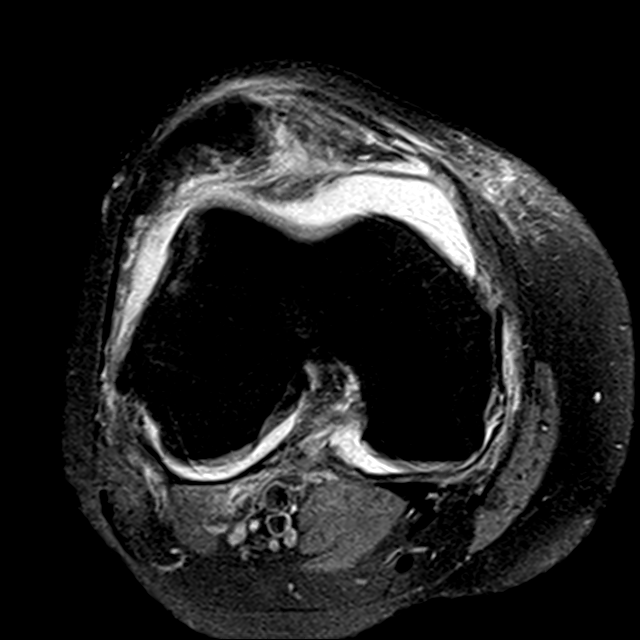
[im 27/32]
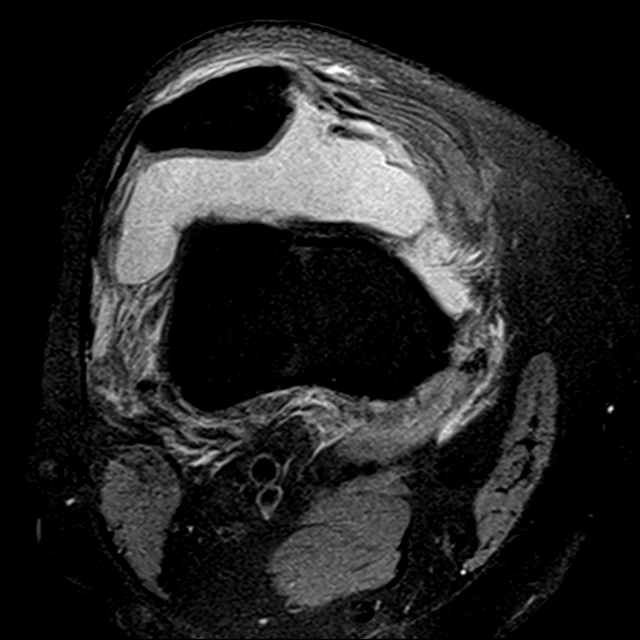

[Series 5: T1 · coronal · 3.0mm · 0.21mm/px · 3 of 32 slices shown]
[im 6/32]
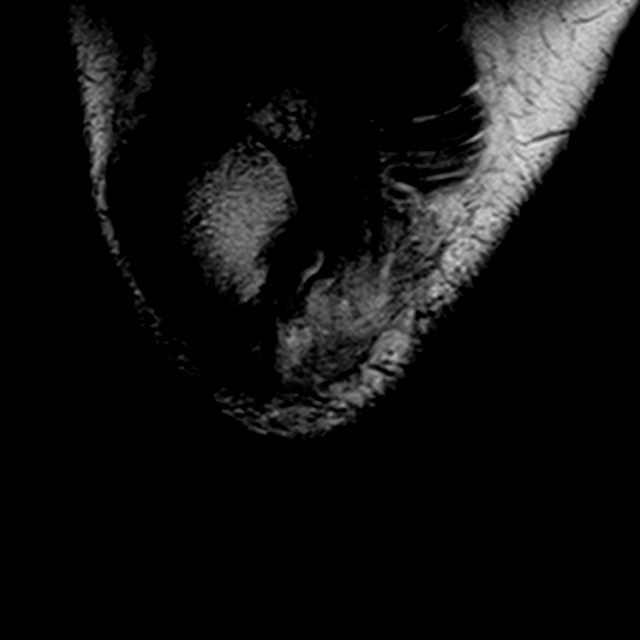
[im 16/32]
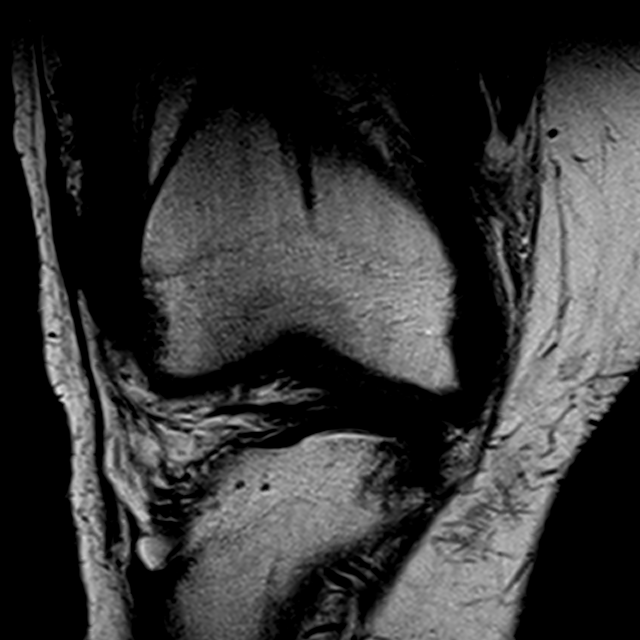
[im 26/32]
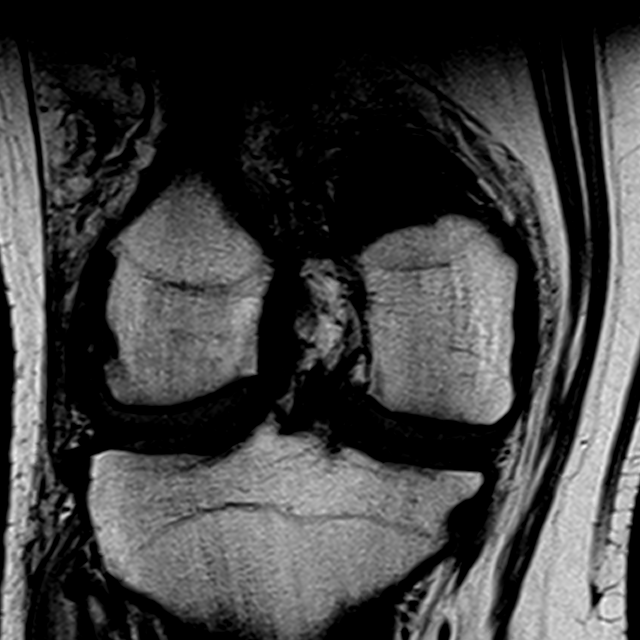

[Series 6: pdfs sag · sagittal · 3.0mm · 0.23mm/px · 3 of 32 slices shown]
[im 6/32]
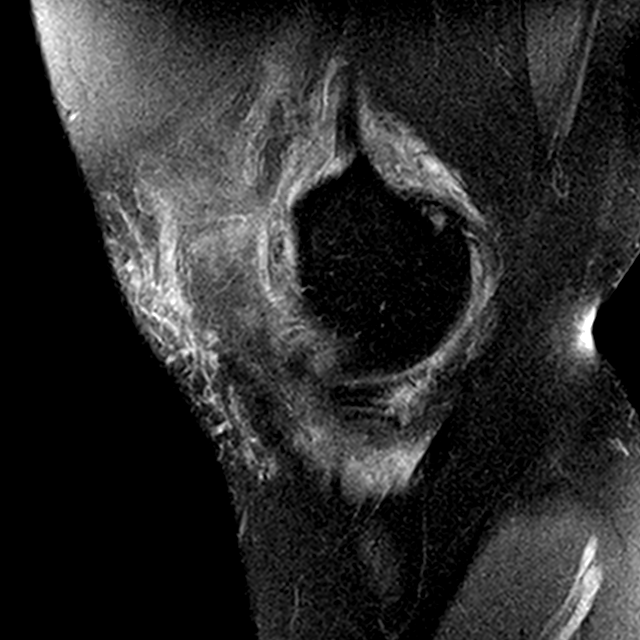
[im 16/32]
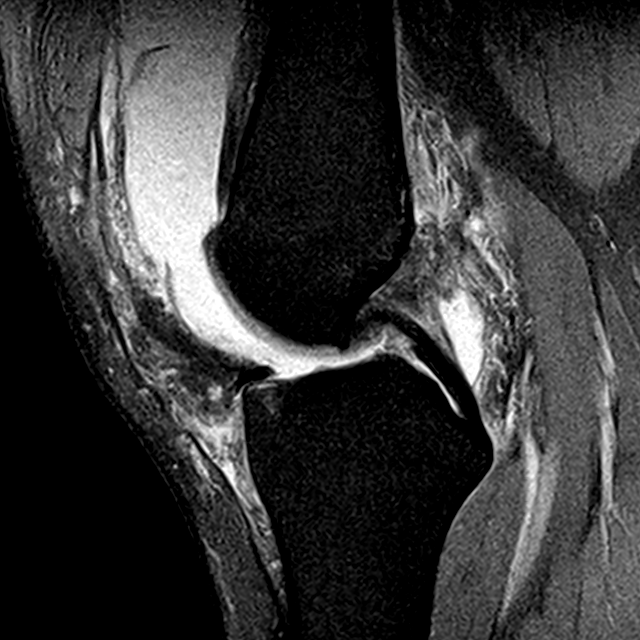
[im 26/32]
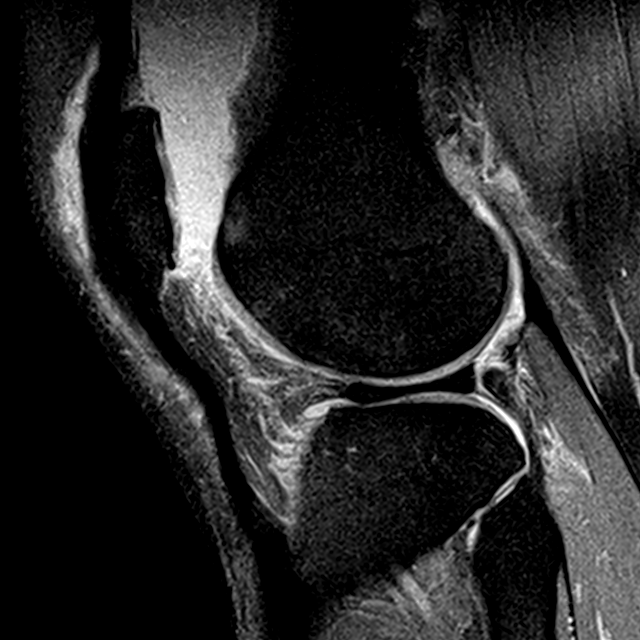

[Series 7: t2fs cor · coronal · 3.0mm · 0.22mm/px · 3 of 32 slices shown]
[im 6/32]
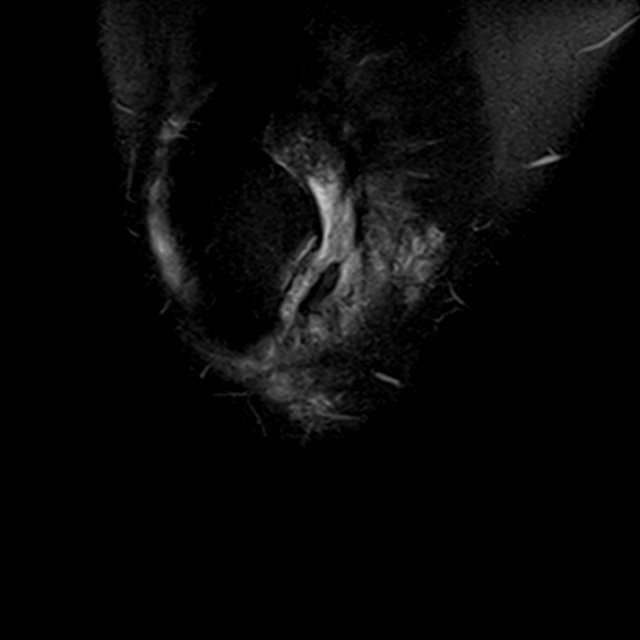
[im 16/32]
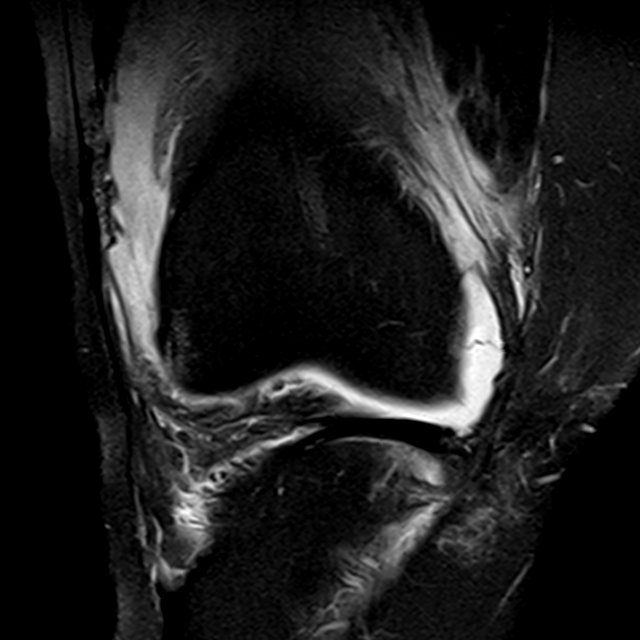
[im 26/32]
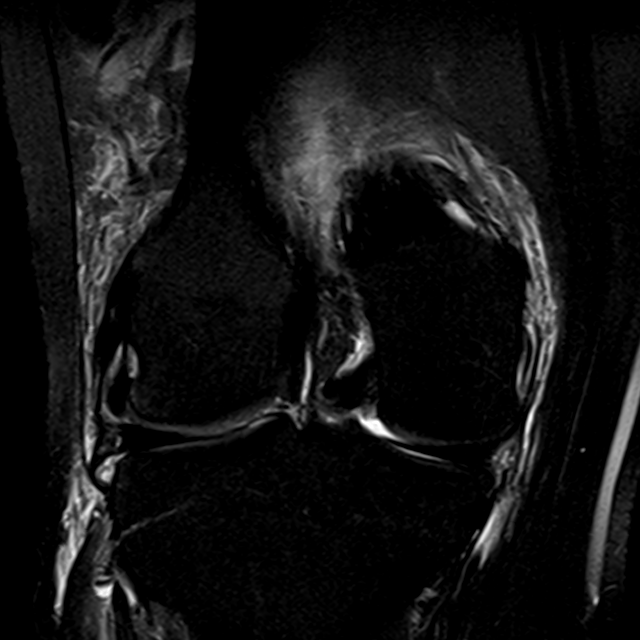

[12 of 40 positions shown; findings below may reference images not displayed]

FINDINGS: MENISCI

Medial meniscus:  Intact.

Lateral meniscus: Mild increased signal in the anterior horn of the
lateral meniscus likely reflecting mild degeneration. No discrete
tear.

LIGAMENTS

Cruciates: Intact ACL. Mild increased signal involving the proximal
PCL which may reflect sequela prior injury versus mild strain. No
tear of the PCL.

Collaterals: Medial collateral ligament is intact. Lateral
collateral ligament complex is intact.

CARTILAGE

Patellofemoral: Partial-thickness cartilage loss of the lateral
patellar facet with small area of full-thickness cartilage loss.
High-grade partial-thickness cartilage loss with areas of
full-thickness cartilage loss of the lateral trochlea with mild
subchondral reactive marrow edema.

Medial: Mild partial-thickness cartilage loss of the medial
femorotibial compartment.

Lateral:  No focal chondral defect.

Joint: Large joint effusion. Edema in Hoffa's fat. No plical
thickening.

Popliteal Fossa:  No Baker cyst.  Intact popliteus tendon.

Extensor Mechanism: Intact quadriceps tendon. Mild tendinosis of the
proximal patellar tendon without a tear. Intact lateral patellar
retinaculum. Mild stripping of the VMO. Partial tear of the patellar
insertion of the MPFL and medial patellar retinaculum. Lateral
patellar tilting. TT -TG distance 2 cm.

Bones: Mild marrow edema in the medial patella and periphery of the
anterolateral femoral condyles as can be seen with transient lateral
patellar dislocation.

Other: No fluid collection or hematoma.
IMPRESSION: 1. Findings concerning for transient lateral patellar dislocation
with mild osseous contusions of the medial patella and periphery of
the anterolateral femoral condyles. Lateral patellar tilting. TT -TG
distance 2 cm. Partial tear of the patellar insertion of the MPFL
and medial patellar retinaculum.
2. Cartilage abnormalities of the medial femorotibial compartment
and patellofemoral compartment as described above.
3. Mild tendinosis of the proximal patellar tendon.

## 2024-11-11 ENCOUNTER — Ambulatory Visit
Admission: EM | Admit: 2024-11-11 | Discharge: 2024-11-11 | Disposition: A | Discharge status: Home / Self Care | Payer: Self-pay | Source: Home / Self Care | Attending: Family Medicine | Admitting: Family Medicine

## 2024-11-11 DIAGNOSIS — J340 Abscess, furuncle and carbuncle of nose: Secondary | ICD-10-CM

## 2024-11-11 MED ORDER — DOXYCYCLINE HYCLATE 100 MG PO CAPS: 100.0000 mg | ORAL_CAPSULE | Freq: Two times a day (BID) | ORAL | 0 refills | Status: AC

## 2024-11-11 NOTE — ED Triage Notes (Signed)
 Patient to Urgent Care with complaints of redness and swelling around the tip of his nose. Symptoms x2 weeks.   Reports he completed a z-pack today with little improvement.

## 2024-11-11 NOTE — Discharge Instructions (Signed)
 You have an infection of the skin of your nose. Stop by the pharmacy to pick up your antibiotics.  Follow up with your primary care provider or return to the urgent care, if not improving.

## 2024-11-11 NOTE — ED Provider Notes (Signed)
 " [facility]    CSN: [id number] Arrival date & time: 11/11/24  1052      History   Chief Complaint Chief Complaint  Patient presents with   Skin Infection    HPI Manvir Prabhu is a 39 y.o. male.   HPI  Ryin presents for infection on the tip of his nose that for the past 10 days. Tried neosporin and Zpac for his symptoms after doing an E-visit.  The area is red and painful.  Has not had similar sx previously.  Thinks he infected the scab on his nose.  No fever and bug bites.  Has typical seasonal runny nose.    Past Medical History:  Diagnosis Date   Asthma    GERD (gastroesophageal reflux disease)     There are no active problems to display for this patient.   History reviewed. No pertinent surgical history.     Home Medications    Prior to Admission medications  Medication Sig Start Date End Date Taking? Authorizing Provider  doxycycline  (VIBRAMYCIN ) 100 MG capsule Take 1 capsule (100 mg total) by mouth 2 (two) times daily. 11/11/24  Yes Emanuell Morina, DO  albuterol  (PROVENTIL  HFA;VENTOLIN  HFA) 108 (90 BASE) MCG/ACT inhaler Inhale 2 puffs into the lungs every 6 (six) hours as needed for wheezing or shortness of breath. 08/30/15   Floy Roberts, MD  ibuprofen  (ADVIL ,MOTRIN ) 800 MG tablet Take 1 tablet (800 mg total) by mouth every 8 (eight) hours as needed for mild pain. 01/27/16   Ward, Josette SAILOR, DO    Family History Family History  Problem Relation Age of Onset   Mental illness Mother     Social History Social History[1]   Allergies   Elemental sulfur and Penicillins   Review of Systems Review of Systems :negative unless otherwise stated in HPI.      Physical Exam Triage Vital Signs ED Triage Vitals  Encounter Vitals Group     BP 11/11/24 1144 136/88     Girls Systolic BP Percentile --      Girls Diastolic BP Percentile --      Boys Systolic BP Percentile --      Boys Diastolic BP Percentile --      Pulse Rate 11/11/24 1144 61      Resp 11/11/24 1144 16     Temp 11/11/24 1144 98.5 F (36.9 C)     Temp Source 11/11/24 1144 Oral     SpO2 11/11/24 1144 97 %     Weight 11/11/24 1144 250 lb (113.4 kg)     Height --      Head Circumference --      Peak Flow --      Pain Score 11/11/24 1151 4     Pain Loc --      Pain Education --      Exclude from Growth Chart --    No data found.  Updated Vital Signs BP 136/88 (BP Location: Right Arm)   Pulse 61   Temp 98.5 F (36.9 C) (Oral)   Resp 16   Wt 113.4 kg   SpO2 97%   BMI 33.91 kg/m   Visual Acuity Right Eye Distance:   Left Eye Distance:   Bilateral Distance:    Right Eye Near:   Left Eye Near:    Bilateral Near:     Physical Exam  GEN: alert, well appearing male, in no acute distress  EYES: no scleral injection or discharge CV: regular rate  RESP:  no increased work of breathing MSK: no extremity edema  NEURO: alert, moves all extremities appropriately SKIN: warm and dry;  erythematous patch on the right distal nose     UC Treatments / Results  Labs (all labs ordered are listed, but only abnormal results are displayed) Labs Reviewed - No data to display  EKG   Radiology No results found.  Procedures Procedures (including critical care time)  Medications Ordered in UC Medications - No data to display  Initial Impression / Assessment and Plan / UC Course  I have reviewed the triage vital signs and the nursing notes.  Pertinent labs & imaging results that were available during my care of the patient were reviewed by me and considered in my medical decision making (see chart for details).     Patient is a 39 y.o. male who presents for nasal rash.  Overall, patient is well-appearing and well-hydrated.  Vital signs stable.  Tore is afebrile.  Exam concerning for cellulitis.  Treat with doxycyline BID for 7 days as below.  No sign of infection to suggest antifungals at this time.  Not likely viral exanthem.    Reviewed  expectations regarding course of current medical issues.  All questions asked were answered.  Outlined signs and symptoms indicating need for more acute intervention. Patient verbalized understanding. After Visit Summary given.   Final Clinical Impressions(s) / UC Diagnoses   Final diagnoses:  Cellulitis of nasal tip     Discharge Instructions      You have an infection of the skin of your nose. Stop by the pharmacy to pick up your antibiotics.  Follow up with your primary care provider or return to the urgent care, if not improving.       ED Prescriptions     Medication Sig Dispense Auth. Provider   doxycycline  (VIBRAMYCIN ) 100 MG capsule Take 1 capsule (100 mg total) by mouth 2 (two) times daily. 14 capsule Keylan Costabile, DO      PDMP not reviewed this encounter.               [1]  Social History Tobacco Use   Smoking status: Every Day    Current packs/day: 1.00    Types: Cigarettes   Smokeless tobacco: Never  Vaping Use   Vaping status: Never Used  Substance Use Topics   Alcohol use: Not Currently   Drug use: Yes    Types: Methamphetamines     Kiyon Fidalgo, DO 11/11/24 1203  "
